# Patient Record
Sex: Male | Born: 1980
Health system: Southern US, Community
[De-identification: ages and names within clinical notes are randomized; demographics above are authoritative.]

## PROBLEM LIST (undated history)

## (undated) DIAGNOSIS — E785 Hyperlipidemia, unspecified: Secondary | ICD-10-CM

## (undated) HISTORY — PX: INNER EAR SURGERY: SHX679

## (undated) HISTORY — PX: BACK SURGERY: SHX140

## (undated) HISTORY — DX: Hyperlipidemia, unspecified: E78.5

---

## 2009-06-17 ENCOUNTER — Encounter: Admission: RE | Admit: 2009-06-17 | Discharge: 2009-06-17 | Payer: Self-pay | Admitting: Neurosurgery

## 2009-10-18 ENCOUNTER — Encounter: Admission: RE | Admit: 2009-10-18 | Discharge: 2009-10-18 | Payer: Self-pay | Admitting: Neurosurgery

## 2010-07-19 ENCOUNTER — Encounter: Admission: RE | Admit: 2010-07-19 | Discharge: 2010-07-19 | Payer: Self-pay | Admitting: Orthopedic Surgery

## 2016-04-21 ENCOUNTER — Ambulatory Visit (INDEPENDENT_AMBULATORY_CARE_PROVIDER_SITE_OTHER): Payer: BLUE CROSS/BLUE SHIELD | Admitting: Physician Assistant

## 2016-04-21 ENCOUNTER — Encounter: Payer: Self-pay | Admitting: Physician Assistant

## 2016-04-21 VITALS — BP 139/75 | HR 74 | Temp 97.4°F | Ht 72.0 in | Wt 280.3 lb

## 2016-04-21 DIAGNOSIS — M5441 Lumbago with sciatica, right side: Secondary | ICD-10-CM

## 2016-04-21 MED ORDER — CYCLOBENZAPRINE HCL 10 MG PO TABS: 10.0000 mg | ORAL_TABLET | Freq: Three times a day (TID) | ORAL | Status: DC | PRN

## 2016-04-21 MED ORDER — TRAMADOL HCL 50 MG PO TABS: 50.0000 mg | ORAL_TABLET | Freq: Three times a day (TID) | ORAL | Status: DC | PRN

## 2016-04-21 MED ORDER — PREDNISONE 10 MG (21) PO TBPK: 10.0000 mg | ORAL_TABLET | Freq: Every day | ORAL | Status: DC

## 2016-04-21 NOTE — Progress Notes (Signed)
Subjective:     Patient ID: Steven Romero, male   DOB: [birth year], 35 y.o.   MRN: [id number]  HPI Pt here as a new pt He has a long hx of intermit LBP with R radicular leg pain He has even seen a specialist in the past that recommended surgery At that time he rested and sx improved He work at a job that entails some heavy lifting Current sx for several days S increase with sitting No meds for sx  Review of Systems + R lower back pain + radicular pain to the R leg/buttock Intermit numbness No change in bowel/bladder function    Objective:   Physical Exam Appears uncomfortable Prefers to stand Uncomfortable with sitting ++ TTP of the midline to R L-spine area No palp spasm Decrease in ROM due to sx SLR positive on the R DTR 1+/= lower ext    Assessment:     1. Right-sided low back pain with right-sided sciatica        Plan:     Given hx and sx Sterapred DosePak 12 day 10 mg Flexeril 10mg  tid #30 - SE reviewed Ultram 50mg  #30- rx given today Heat/Ice Gentle stretching Work note If sx continue f/u for Xray and possible referral for MRI

## 2016-04-21 NOTE — Patient Instructions (Signed)

## 2016-04-24 ENCOUNTER — Telehealth: Payer: Self-pay | Admitting: Physician Assistant

## 2016-04-24 DIAGNOSIS — M545 Low back pain: Secondary | ICD-10-CM

## 2016-04-24 DIAGNOSIS — M5410 Radiculopathy, site unspecified: Secondary | ICD-10-CM

## 2016-04-24 NOTE — Telephone Encounter (Signed)
Ok to schedule MRI for LBP and radicular leg pain at hospital of his choice

## 2016-04-24 NOTE — Telephone Encounter (Signed)
Ok to extend work note 

## 2016-04-24 NOTE — Telephone Encounter (Signed)
Order placed

## 2016-04-25 ENCOUNTER — Telehealth: Payer: Self-pay | Admitting: Physician Assistant

## 2016-04-25 ENCOUNTER — Telehealth: Payer: Self-pay

## 2016-04-25 NOTE — Telephone Encounter (Signed)
Expected a call back yesterday about MRI (Debbi will work on getting authorized today) and a note for work   [employer]

## 2016-04-25 NOTE — Telephone Encounter (Signed)
We have been trying to call pt since yesterday. Called earlier this morning and left a message as pt didn't answer. Will attempt to call pt again and close this encounter as pt has 2 open calls.

## 2016-04-25 NOTE — Telephone Encounter (Signed)
Extended note for work written and pt is aware. They are aware we are working on the MRI to try and get it scheduled today. We will call as soon as we have it scheduled. Pt's wife voiced understanding.

## 2016-04-26 ENCOUNTER — Telehealth: Payer: Self-pay | Admitting: Physician Assistant

## 2016-04-26 ENCOUNTER — Ambulatory Visit (HOSPITAL_COMMUNITY)
Admission: RE | Admit: 2016-04-26 | Discharge: 2016-04-26 | Disposition: A | Discharge status: Home / Self Care | Payer: BLUE CROSS/BLUE SHIELD | Source: Ambulatory Visit | Attending: Nurse Practitioner | Admitting: Nurse Practitioner

## 2016-04-26 ENCOUNTER — Encounter: Payer: Self-pay | Admitting: Nurse Practitioner

## 2016-04-26 ENCOUNTER — Ambulatory Visit (INDEPENDENT_AMBULATORY_CARE_PROVIDER_SITE_OTHER): Payer: BLUE CROSS/BLUE SHIELD | Admitting: Nurse Practitioner

## 2016-04-26 ENCOUNTER — Other Ambulatory Visit: Payer: Self-pay | Admitting: Nurse Practitioner

## 2016-04-26 VITALS — BP 143/86 | HR 100 | Temp 97.5°F | Ht 72.0 in | Wt 277.0 lb

## 2016-04-26 DIAGNOSIS — M4806 Spinal stenosis, lumbar region: Secondary | ICD-10-CM | POA: Insufficient documentation

## 2016-04-26 DIAGNOSIS — M5441 Lumbago with sciatica, right side: Secondary | ICD-10-CM | POA: Diagnosis not present

## 2016-04-26 DIAGNOSIS — M5417 Radiculopathy, lumbosacral region: Secondary | ICD-10-CM

## 2016-04-26 DIAGNOSIS — M5127 Other intervertebral disc displacement, lumbosacral region: Secondary | ICD-10-CM | POA: Diagnosis not present

## 2016-04-26 MED ORDER — HYDROCODONE-ACETAMINOPHEN 5-325 MG PO TABS: 1.0000 | ORAL_TABLET | Freq: Four times a day (QID) | ORAL | Status: DC | PRN

## 2016-04-26 MED ORDER — KETOROLAC TROMETHAMINE 60 MG/2ML IM SOLN
60.0000 mg | Freq: Once | INTRAMUSCULAR | Status: AC
  Administered 2016-04-26: 60 mg via INTRAMUSCULAR

## 2016-04-26 NOTE — Addendum Note (Signed)
Addended by: Bennie PieriniMARTIN, MARY-MARGARET on: 04/26/2016 11:24 AM   Modules accepted: Orders

## 2016-04-26 NOTE — Progress Notes (Addendum)
   Subjective:    Patient ID: Steven Romero, male    DOB: 03-Oct-1981, 35 y.o.   MRN: [id number]  HPI  Patient in today c/o continued back pain-he saw B.Webster on 04/21/16 with same complaint. Dx with back pain with right sided sciatica- was given steroid pack, flexeril, and ultram. MRI was ordered but has gone to peer review. He rates pain today 10/10.and is radiating down right leg with numbness of right buttocks. He says that he cannot even get an erection since Friday and is having numbness in right testicle.  * has seen Dr. Channing Muttersoy in the past for disc problem. Has never had back surgery.  Review of Systems  Constitutional: Negative.   HENT: Negative.   Respiratory: Negative.   Cardiovascular: Negative.   Genitourinary: Negative.   Neurological: Negative.   Psychiatric/Behavioral: Negative.   All other systems reviewed and are negative.      Objective:   Physical Exam  Constitutional: He is oriented to person, place, and time. He appears well-developed and well-nourished. He appears distressed.  Cardiovascular: Normal rate, regular rhythm and normal heart sounds.   Pulmonary/Chest: Effort normal and breath sounds normal.  Musculoskeletal:  Decrease ROM of lumbar spine due to pain with slighest movement Right leg weaker then left Cannot sit still-   Neurological: He is alert and oriented to person, place, and time. He has normal reflexes.  Psychiatric: He has a normal mood and affect. His behavior is normal. Judgment and thought content normal.   BP 143/86 mmHg  Pulse 100  Temp(Src) 97.5 F (36.4 C) (Oral)  Ht 6' (1.829 m)  Wt 277 lb (125.646 kg)  BMI 37.56 kg/m2        Assessment & Plan:   1. Right-sided low back pain with right-sided sciatica    Meds ordered this encounter  Medications  . ketorolac (TORADOL) injection 60 mg    Sig:   . HYDROcodone-acetaminophen (LORTAB) 5-325 MG tablet    Sig: Take 1 tablet by mouth every 6 (six) hours as needed for moderate pain.      Dispense:  40 tablet    Refill:  0    Order Specific Question:  Supervising Provider    Answer:  Ernestina PennaMOORE, DONALD W [[id number]]    Trying to get approval for MRI Continue current meds  Mary-Margaret Daphine DeutscherMartin, FNP

## 2016-04-28 ENCOUNTER — Telehealth: Payer: Self-pay | Admitting: Physician Assistant

## 2016-04-28 NOTE — Telephone Encounter (Signed)
Patient was seen 5/24

## 2016-04-28 NOTE — Telephone Encounter (Signed)
Patient called stating that pain medication is not helping with the pain.  Patient will soon be out of flexeril and prednisone.  Would you like for him to continue this medication?

## 2016-04-28 NOTE — Telephone Encounter (Signed)
Patient aware and work note extended to 05/08/16.

## 2016-04-28 NOTE — Telephone Encounter (Signed)
There is no other pain meds that i can offer - he can continue flexeril if helps but wen runs out of prednisone he cannot have anymore

## 2020-05-18 ENCOUNTER — Telehealth: Payer: Self-pay | Admitting: Family Medicine

## 2020-05-19 ENCOUNTER — Ambulatory Visit: Payer: Self-pay | Admitting: Family Medicine

## 2020-07-09 ENCOUNTER — Ambulatory Visit: Payer: BLUE CROSS/BLUE SHIELD | Admitting: Family Medicine

## 2020-07-09 ENCOUNTER — Other Ambulatory Visit: Payer: Self-pay

## 2020-07-09 ENCOUNTER — Encounter: Payer: Self-pay | Admitting: Family Medicine

## 2020-07-09 VITALS — BP 142/86 | HR 83 | Temp 97.5°F | Ht 73.0 in | Wt 333.4 lb

## 2020-07-09 DIAGNOSIS — B360 Pityriasis versicolor: Secondary | ICD-10-CM | POA: Diagnosis not present

## 2020-07-09 DIAGNOSIS — K649 Unspecified hemorrhoids: Secondary | ICD-10-CM | POA: Diagnosis not present

## 2020-07-09 MED ORDER — HYDROCORTISONE ACETATE 25 MG RE SUPP: 25.0000 mg | Freq: Two times a day (BID) | RECTAL | 2 refills | Status: DC | PRN

## 2020-07-09 MED ORDER — CICLOPIROX OLAMINE 0.77 % EX CREA
TOPICAL_CREAM | Freq: Two times a day (BID) | CUTANEOUS | 2 refills | Status: DC
Start: 2020-07-09 — End: 2020-10-14

## 2020-07-09 MED ORDER — HYDROCORTISONE (PERIANAL) 2.5 % EX CREA: 1.0000 "application " | TOPICAL_CREAM | Freq: Two times a day (BID) | CUTANEOUS | 2 refills | Status: DC

## 2020-07-09 NOTE — Patient Instructions (Addendum)
Tucks pads   Hemorrhoids Hemorrhoids are swollen veins that may develop:  In the butt (rectum). These are called internal hemorrhoids.  Around the opening of the butt (anus). These are called external hemorrhoids. Hemorrhoids can cause pain, itching, or bleeding. Most of the time, they do not cause serious problems. They usually get better with diet changes, lifestyle changes, and other home treatments. What are the causes? This condition may be caused by:  Having trouble pooping (constipation).  Pushing hard (straining) to poop.  Watery poop (diarrhea).  Pregnancy.  Being very overweight (obese).  Sitting for long periods of time.  Heavy lifting or other activity that causes you to strain.  Anal sex.  Riding a bike for a long period of time. What are the signs or symptoms? Symptoms of this condition include:  Pain.  Itching or soreness in the butt.  Bleeding from the butt.  Leaking poop.  Swelling in the area.  One or more lumps around the opening of your butt. How is this diagnosed? A doctor can often diagnose this condition by looking at the affected area. The doctor may also:  Do an exam that involves feeling the area with a gloved hand (digital rectal exam).  Examine the area inside your butt using a small tube (anoscope).  Order blood tests. This may be done if you have lost a lot of blood.  Have you get a test that involves looking inside the colon using a flexible tube with a camera on the end (sigmoidoscopy or colonoscopy). How is this treated? This condition can usually be treated at home. Your doctor may tell you to change what you eat, make lifestyle changes, or try home treatments. If these do not help, procedures can be done to remove the hemorrhoids or make them smaller. These may involve:  Placing rubber bands at the base of the hemorrhoids to cut off their blood supply.  Injecting medicine into the hemorrhoids to shrink them.  Shining a  type of light energy onto the hemorrhoids to cause them to fall off.  Doing surgery to remove the hemorrhoids or cut off their blood supply. Follow these instructions at home: Eating and drinking   Eat foods that have a lot of fiber in them. These include whole grains, beans, nuts, fruits, and vegetables.  Ask your doctor about taking products that have added fiber (fibersupplements).  Reduce the amount of fat in your diet. You can do this by: ? Eating low-fat dairy products. ? Eating less red meat. ? Avoiding processed foods.  Drink enough fluid to keep your pee (urine) pale yellow. Managing pain and swelling   Take a warm-water bath (sitz bath) for 20 minutes to ease pain. Do this 3-4 times a day. You may do this in a bathtub or using a portable sitz bath that fits over the toilet.  If told, put ice on the painful area. It may be helpful to use ice between your warm baths. ? Put ice in a plastic bag. ? Place a towel between your skin and the bag. ? Leave the ice on for 20 minutes, 2-3 times a day. General instructions  Take over-the-counter and prescription medicines only as told by your doctor. ? Medicated creams and medicines may be used as told.  Exercise often. Ask your doctor how much and what kind of exercise is best for you.  Go to the bathroom when you have the urge to poop. Do not wait.  Avoid pushing too hard when you   poop.  Keep your butt dry and clean. Use wet toilet paper or moist towelettes after pooping.  Do not sit on the toilet for a long time.  Keep all follow-up visits as told by your doctor. This is important. Contact a doctor if you:  Have pain and swelling that do not get better with treatment or medicine.  Have trouble pooping.  Cannot poop.  Have pain or swelling outside the area of the hemorrhoids. Get help right away if you have:  Bleeding that will not stop. Summary  Hemorrhoids are swollen veins in the butt or around the opening of  the butt.  They can cause pain, itching, or bleeding.  Eat foods that have a lot of fiber in them. These include whole grains, beans, nuts, fruits, and vegetables.  Take a warm-water bath (sitz bath) for 20 minutes to ease pain. Do this 3-4 times a day. This information is not intended to replace advice given to you by your health care provider. Make sure you discuss any questions you have with your health care provider. Document Revised: 11/28/2018 Document Reviewed: 04/11/2018 Elsevier Patient Education  2020 Elsevier Inc.  

## 2020-07-09 NOTE — Progress Notes (Signed)
New Patient Office Visit  Assessment & Plan:  1. Tinea versicolor - ciclopirox (LOPROX) 0.77 % cream; Apply topically 2 (two) times daily.  Dispense: 90 g; Refill: 2  2. Hemorrhoids, unspecified hemorrhoid type - Education provided on hemorrhoids.  Encouraged use of suppositories, cream, and Tucks pads.  Patient will let me know if this is not effective and he needs a referral to have hemorrhoids removed. - hydrocortisone (ANUSOL-HC) 2.5 % rectal cream; Place 1 application rectally 2 (two) times daily.  Dispense: 30 g; Refill: 2 - hydrocortisone (ANUSOL-HC) 25 MG suppository; Place 1 suppository (25 mg total) rectally 2 (two) times daily as needed for hemorrhoids or anal itching.  Dispense: 12 suppository; Refill: 2   Follow-up: Return in about 3 months (around 10/09/2020) for follow-up of chronic medication conditions.   Steven Boston, MSN, APRN, FNP-C [facility]  Subjective:  Patient ID: Steven Romero, male    DOB: [birth year]  Age: 39 y.o. MRN: [id number]  Patient Care Team: Gwenlyn Fudge, FNP as PCP - General (Family Medicine)  CC:  Chief Complaint  Patient presents with  . Establish Care    Patient was seen here in 2017.  . Skin Discoloration    Patient states he noticed discoloration on bilateral legs and foot x 1 year.  . Hemorrhoids    Patient states he has had them for a few years.    HPI Steven Romero presents to establish care.   Patient is reestablishing care at our office.  He has not gone anywhere else, just has not been seen in greater than 3 years.  Patient reports discoloration of bilateral feet and legs that started about a year ago.  States it started with his feet and has just slowly progressed up his legs.  Denies any itching or irritation.  Patient also reports internal and external hemorrhoids that he has had for a few years now.  He denies constipation as long as he stays well-hydrated, which he tries hard at since he works  outside.   Review of Systems  Constitutional: Negative for chills, fever, malaise/fatigue and weight loss.  HENT: Negative for congestion, ear discharge, ear pain, nosebleeds, sinus pain, sore throat and tinnitus.   Eyes: Negative for blurred vision, double vision, pain, discharge and redness.  Respiratory: Negative for cough, shortness of breath and wheezing.   Cardiovascular: Negative for chest pain, palpitations and leg swelling.  Gastrointestinal: Negative for abdominal pain, constipation, diarrhea, heartburn, nausea and vomiting.  Genitourinary: Negative for dysuria, frequency and urgency.  Musculoskeletal: Negative for myalgias.  Skin: Negative for rash.  Neurological: Negative for dizziness, seizures, weakness and headaches.  Psychiatric/Behavioral: Negative for depression, substance abuse and suicidal ideas. The patient is not nervous/anxious.    No current outpatient medications on file.  No Known Allergies  Past Medical History:  Diagnosis Date  . Hyperlipidemia     Past Surgical History:  Procedure Laterality Date  . BACK SURGERY      Family History  Problem Relation Age of Onset  . Breast cancer Mother   . Cancer Father        colon  . Glaucoma Maternal Grandfather   . Cancer Paternal Grandmother        jaw    Social History   Socioeconomic History  . Marital status: Unknown    Spouse name: Not on file  . Number of children: Not on file  . Years of education: Not on file  . Highest education level: Not  on file  Occupational History  . Not on file  Tobacco Use  . Smoking status: Current Every Day Smoker    Packs/day: 1.00    Types: Cigarettes  . Smokeless tobacco: Never Used  Vaping Use  . Vaping Use: Former  Substance and Sexual Activity  . Alcohol use: Yes    Comment: occ  . Drug use: Yes    Types: Marijuana  . Sexual activity: Not on file  Other Topics Concern  . Not on file  Social History Narrative  . Not on file   Social  Determinants of Health   Financial Resource Strain:   . Difficulty of Paying Living Expenses:   Food Insecurity:   . Worried About Programme researcher, broadcasting/film/video in the Last Year:   . Barista in the Last Year:   Transportation Needs:   . Freight forwarder (Medical):   Marland Kitchen Lack of Transportation (Non-Medical):   Physical Activity:   . Days of Exercise per Week:   . Minutes of Exercise per Session:   Stress:   . Feeling of Stress :   Social Connections:   . Frequency of Communication with Friends and Family:   . Frequency of Social Gatherings with Friends and Family:   . Attends Religious Services:   . Active Member of Clubs or Organizations:   . Attends Banker Meetings:   Marland Kitchen Marital Status:   Intimate Partner Violence:   . Fear of Current or Ex-Partner:   . Emotionally Abused:   Marland Kitchen Physically Abused:   . Sexually Abused:     Objective:   Today's Vitals: BP (!) 142/86   Pulse 83   Temp (!) 97.5 F (36.4 C) (Temporal)   Ht 6\' 1"  (1.854 m)   Wt (!) 333 lb 6.4 oz (151.2 kg)   SpO2 95%   BMI 43.99 kg/m   Physical Exam Vitals reviewed.  Constitutional:      General: He is not in acute distress.    Appearance: Normal appearance. He is morbidly obese. He is not ill-appearing, toxic-appearing or diaphoretic.  HENT:     Head: Normocephalic and atraumatic.  Eyes:     General: No scleral icterus.       Right eye: No discharge.        Left eye: No discharge.     Conjunctiva/sclera: Conjunctivae normal.  Cardiovascular:     Rate and Rhythm: Normal rate and regular rhythm.     Heart sounds: Normal heart sounds. No murmur heard.  No friction rub. No gallop.   Pulmonary:     Effort: Pulmonary effort is normal. No respiratory distress.     Breath sounds: Normal breath sounds. No stridor. No wheezing, rhonchi or rales.  Musculoskeletal:        General: Normal range of motion.     Cervical back: Normal range of motion.  Skin:    General: Skin is warm and dry.      Comments: Brown discoloration to feet and legs bilaterally.   Neurological:     Mental Status: He is alert and oriented to person, place, and time. Mental status is at baseline.  Psychiatric:        Mood and Affect: Mood normal.        Behavior: Behavior normal.        Thought Content: Thought content normal.        Judgment: Judgment normal.

## 2020-07-12 ENCOUNTER — Encounter: Payer: Self-pay | Admitting: Family Medicine

## 2020-09-13 ENCOUNTER — Encounter: Payer: Self-pay | Admitting: *Deleted

## 2020-10-14 ENCOUNTER — Encounter: Payer: Self-pay | Admitting: Family

## 2020-10-14 ENCOUNTER — Ambulatory Visit: Payer: 59 | Admitting: Family

## 2020-10-14 ENCOUNTER — Other Ambulatory Visit: Payer: Self-pay

## 2020-10-14 VITALS — BP 125/72 | HR 71 | Temp 97.3°F | Ht 73.0 in | Wt 336.0 lb

## 2020-10-14 DIAGNOSIS — F17200 Nicotine dependence, unspecified, uncomplicated: Secondary | ICD-10-CM | POA: Diagnosis not present

## 2020-10-14 DIAGNOSIS — Z23 Encounter for immunization: Secondary | ICD-10-CM | POA: Diagnosis not present

## 2020-10-14 DIAGNOSIS — Z0001 Encounter for general adult medical examination with abnormal findings: Secondary | ICD-10-CM | POA: Diagnosis not present

## 2020-10-14 DIAGNOSIS — Z1159 Encounter for screening for other viral diseases: Secondary | ICD-10-CM | POA: Diagnosis not present

## 2020-10-14 DIAGNOSIS — Z114 Encounter for screening for human immunodeficiency virus [HIV]: Secondary | ICD-10-CM

## 2020-10-14 DIAGNOSIS — K219 Gastro-esophageal reflux disease without esophagitis: Secondary | ICD-10-CM

## 2020-10-14 DIAGNOSIS — Z0000 Encounter for general adult medical examination without abnormal findings: Secondary | ICD-10-CM

## 2020-10-14 DIAGNOSIS — Z716 Tobacco abuse counseling: Secondary | ICD-10-CM

## 2020-10-14 MED ORDER — VARENICLINE TARTRATE 1 MG PO TABS: 1.0000 mg | ORAL_TABLET | Freq: Two times a day (BID) | ORAL | 2 refills | Status: DC

## 2020-10-14 MED ORDER — CHANTIX STARTING MONTH PAK 0.5 MG X 11 & 1 MG X 42 PO TABS: ORAL_TABLET | ORAL | 0 refills | Status: DC

## 2020-10-14 MED ORDER — OMEPRAZOLE 20 MG PO CPDR: 20.0000 mg | DELAYED_RELEASE_CAPSULE | Freq: Every day | ORAL | 3 refills | Status: DC

## 2020-10-14 NOTE — Progress Notes (Signed)
Subjective:    Patient ID: Steven Romero, male    DOB: Jul 02, 1981, 39 y.o.   MRN: [id number]  Chief Complaint  Patient presents with  . Medical Management of Chronic Issues  . Heartburn  . Nicotine Dependence    wants medication to help stop smoking. He has tried patches    PT presents to the office today for CPE. He is currently not taking any medications at this time. Pt denies any headache, palpitations, SOB, or edema at this time.   He reports he smokes about a pack a day and is trying to quit. He has tired the patches, but states they do not stick to him. He reports he works outside and sweats a lot.  Heartburn He complains of belching and heartburn. He reports no early satiety, no globus sensation or no nausea. This is a chronic problem. The current episode started more than 1 year ago. The problem occurs occasionally. The problem has been waxing and waning. The symptoms are aggravated by smoking. Risk factors include obesity and smoking/tobacco exposure. He has tried an antacid for the symptoms. The treatment provided mild relief.  Nicotine Dependence Presents for follow-up visit. His urge triggers include company of smokers. The symptoms have been stable. He smokes 1 pack of cigarettes per day.      Review of Systems  Gastrointestinal: Positive for heartburn. Negative for nausea.  All other systems reviewed and are negative.  Family History  Problem Relation Age of Onset  . Breast cancer Mother   . Colon cancer Father   . Glaucoma Maternal Grandfather   . Bone cancer Paternal Grandmother        jaw   Social History   Socioeconomic History  . Marital status: Unknown    Spouse name: Not on file  . Number of children: Not on file  . Years of education: Not on file  . Highest education level: Not on file  Occupational History  . Not on file  Tobacco Use  . Smoking status: Current Every Day Smoker    Packs/day: 1.00    Types: Cigarettes  . Smokeless tobacco: Never  Used  Vaping Use  . Vaping Use: Former  Substance and Sexual Activity  . Alcohol use: Yes    Comment: occ  . Drug use: Yes    Types: Marijuana  . Sexual activity: Not on file  Other Topics Concern  . Not on file  Social History Narrative  . Not on file   Social Determinants of Health   Financial Resource Strain:   . Difficulty of Paying Living Expenses: Not on file  Food Insecurity:   . Worried About Charity fundraiser in the Last Year: Not on file  . Ran Out of Food in the Last Year: Not on file  Transportation Needs:   . Lack of Transportation (Medical): Not on file  . Lack of Transportation (Non-Medical): Not on file  Physical Activity:   . Days of Exercise per Week: Not on file  . Minutes of Exercise per Session: Not on file  Stress:   . Feeling of Stress : Not on file  Social Connections:   . Frequency of Communication with Friends and Family: Not on file  . Frequency of Social Gatherings with Friends and Family: Not on file  . Attends Religious Services: Not on file  . Active Member of Clubs or Organizations: Not on file  . Attends Archivist Meetings: Not on file  . Marital Status:  Not on file       Objective:   Physical Exam Vitals reviewed.  Constitutional:      General: He is not in acute distress.    Appearance: He is well-developed. He is obese.  HENT:     Head: Normocephalic.     Right Ear: Tympanic membrane normal.     Left Ear: Tympanic membrane normal.  Eyes:     General:        Right eye: No discharge.        Left eye: No discharge.     Pupils: Pupils are equal, round, and reactive to light.  Neck:     Thyroid: No thyromegaly.  Cardiovascular:     Rate and Rhythm: Normal rate and regular rhythm.     Heart sounds: Normal heart sounds. No murmur heard.   Pulmonary:     Effort: Pulmonary effort is normal. No respiratory distress.     Breath sounds: Normal breath sounds. No wheezing.  Abdominal:     General: Bowel sounds are  normal. There is no distension.     Palpations: Abdomen is soft.     Tenderness: There is no abdominal tenderness.  Musculoskeletal:        General: No tenderness. Normal range of motion.     Cervical back: Normal range of motion and neck supple.  Skin:    General: Skin is warm and dry.     Findings: No erythema or rash.  Neurological:     Mental Status: He is alert and oriented to person, place, and time.     Cranial Nerves: No cranial nerve deficit.     Deep Tendon Reflexes: Reflexes are normal and symmetric.  Psychiatric:        Behavior: Behavior normal.        Thought Content: Thought content normal.        Judgment: Judgment normal.      BP 125/72   Pulse 71   Temp (!) 97.3 F (36.3 C) (Temporal)   Ht _0  (1.854 m)   Wt (!) 336 lb (152.4 kg)   SpO2 96%   BMI 44.33 kg/m       Assessment & Plan:  Steven Romero comes in today with chief complaint of Medical Management of Chronic Issues, Heartburn, and Nicotine Dependence (wants medication to help stop smoking. He has tried patches )   Diagnosis and orders addressed:  1. Annual physical exam - CMP14+EGFR - CBC with Differential/Platelet - Lipid panel - TSH - HIV Antibody (routine testing w rflx) - Hepatitis C antibody  2. Current smoker Smoking cessation  - CMP14+EGFR - CBC with Differential/Platelet - varenicline (CHANTIX STARTING MONTH PAK) 0.5 MG X 11 & 1 MG X 42 tablet; Take one 0.5 mg tablet by mouth once daily for 3 days, then increase to one 0.5 mg tablet twice daily for 4 days, then increase to one 1 mg tablet twice daily.  Dispense: 53 tablet; Refill: 0 - varenicline (CHANTIX CONTINUING MONTH PAK) 1 MG tablet; Take 1 tablet (1 mg total) by mouth 2 (two) times daily.  Dispense: 60 tablet; Refill: 2  3. Encounter for smoking cessation counseling Spent approx 5 mins of smoking cessation Will start Chantix today Possible adverse effects discussed, will stop if increased depression or SI -  CMP14+EGFR - CBC with Differential/Platelet - varenicline (CHANTIX STARTING MONTH PAK) 0.5 MG X 11 & 1 MG X 42 tablet; Take one 0.5 mg tablet by mouth once daily for 3  days, then increase to one 0.5 mg tablet twice daily for 4 days, then increase to one 1 mg tablet twice daily.  Dispense: 53 tablet; Refill: 0 - varenicline (CHANTIX CONTINUING MONTH PAK) 1 MG tablet; Take 1 tablet (1 mg total) by mouth 2 (two) times daily.  Dispense: 60 tablet; Refill: 2  4. Need for hepatitis C screening test - CMP14+EGFR - CBC with Differential/Platelet  5. Encounter for screening for HIV - CMP14+EGFR - CBC with Differential/Platelet  6. Gastroesophageal reflux disease, unspecified whether esophagitis present - CMP14+EGFR - CBC with Differential/Platelet - omeprazole (PRILOSEC) 20 MG capsule; Take 1 capsule (20 mg total) by mouth daily.  Dispense: 90 capsule; Refill: 3  7. Morbid obesity ([place])   Labs pending Health Maintenance reviewed Diet and exercise encouraged  Follow up plan: 1 year    Evelina Dun, FNP

## 2020-10-14 NOTE — Patient Instructions (Signed)
Varenicline oral tablets What is this medicine? VARENICLINE (var e NI kleen) is used to help people quit smoking. It is used with a patient support program recommended by your physician. This medicine may be used for other purposes; ask your health care provider or pharmacist if you have questions. COMMON BRAND NAME(S): Chantix What should I tell my health care provider before I take this medicine? They need to know if you have any of these conditions:  heart disease  if you often drink alcohol  kidney disease  mental illness  on hemodialysis  seizures  history of stroke  suicidal thoughts, plans, or attempt; a previous suicide attempt by you or a family member  an unusual or allergic reaction to varenicline, other medicines, foods, dyes, or preservatives  pregnant or trying to get pregnant  breast-feeding How should I use this medicine? Take this medicine by mouth after eating. Take with a full glass of water. Follow the directions on the prescription label. Take your doses at regular intervals. Do not take your medicine more often than directed. There are 3 ways you can use this medicine to help you quit smoking; talk to your health care professional to decide which plan is right for you: 1) you can choose a quit date and start this medicine 1 week before the quit date, or, 2) you can start taking this medicine before you choose a quit date, and then pick a quit date between day 8 and 35 days of treatment, or, 3) if you are not sure that you are able or willing to quit smoking right away, start taking this medicine and slowly decrease the amount you smoke as directed by your health care professional with the goal of being cigarette-free by week 12 of treatment. Stick to your plan; ask about support groups or other ways to help you remain cigarette-free. If you are motivated to quit smoking and did not succeed during a previous attempt with this medicine for reasons other than  side effects, or if you returned to smoking after this treatment, speak with your health care professional about whether another course of this medicine may be right for you. A special MedGuide will be given to you by the pharmacist with each prescription and refill. Be sure to read this information carefully each time. Talk to your pediatrician regarding the use of this medicine in children. This medicine is not approved for use in children. Overdosage: If you think you have taken too much of this medicine contact a poison control center or emergency room at once. NOTE: This medicine is only for you. Do not share this medicine with others. What if I miss a dose? If you miss a dose, take it as soon as you can. If it is almost time for your next dose, take only that dose. Do not take double or extra doses. What may interact with this medicine?  alcohol  insulin  other medicines used to help people quit smoking  theophylline  warfarin This list may not describe all possible interactions. Give your health care provider a list of all the medicines, herbs, non-prescription drugs, or dietary supplements you use. Also tell them if you smoke, drink alcohol, or use illegal drugs. Some items may interact with your medicine. What should I watch for while using this medicine? It is okay if you do not succeed at your attempt to quit and have a cigarette. You can still continue your quit attempt and keep using this medicine as directed.   Just throw away your cigarettes and get back to your quit plan. Talk to your health care provider before using other treatments to quit smoking. Using this medicine with other treatments to quit smoking may increase the risk for side effects compared to using a treatment alone. You may get drowsy or dizzy. Do not drive, use machinery, or do anything that needs mental alertness until you know how this medicine affects you. Do not stand or sit up quickly, especially if you are  an older patient. This reduces the risk of dizzy or fainting spells. Decrease the number of alcoholic beverages that you drink during treatment with this medicine until you know if this medicine affects your ability to tolerate alcohol. Some people have experienced increased drunkenness (intoxication), unusual or sometimes aggressive behavior, or no memory of things that have happened (amnesia) during treatment with this medicine. Sleepwalking can happen during treatment with this medicine, and can sometimes lead to behavior that is harmful to you, other people, or property. Stop taking this medicine and tell your doctor if you start sleepwalking or have other unusual sleep-related activity. After taking this medicine, you may get up out of bed and do an activity that you do not know you are doing. The next morning, you may have no memory of this. Activities include driving a car ("sleep-driving"), making and eating food, talking on the phone, sexual activity, and sleep-walking. Serious injuries have occurred. Stop the medicine and call your doctor right away if you find out you have done any of these activities. Do not take this medicine if you have used alcohol that evening. Do not take it if you have taken another medicine for sleep. The risk of doing these sleep-related activities is higher. Patients and their families should watch out for new or worsening depression or thoughts of suicide. Also watch out for sudden changes in feelings such as feeling anxious, agitated, panicky, irritable, hostile, aggressive, impulsive, severely restless, overly excited and hyperactive, or not being able to sleep. If this happens, call your health care professional. If you have diabetes and you quit smoking, the effects of insulin may be increased and you may need to reduce your insulin dose. Check with your doctor or health care professional about how you should adjust your insulin dose. What side effects may I notice  from receiving this medicine? Side effects that you should report to your doctor or health care professional as soon as possible:  allergic reactions like skin rash, itching or hives, swelling of the face, lips, tongue, or throat  acting aggressive, being angry or violent, or acting on dangerous impulses  breathing problems  changes in emotions or moods  chest pain or chest tightness  feeling faint or lightheaded, falls  hallucination, loss of contact with reality  mouth sores  redness, blistering, peeling or loosening of the skin, including inside the mouth  signs and symptoms of a stroke like changes in vision; confusion; trouble speaking or understanding; severe headaches; sudden numbness or weakness of the face, arm or leg; trouble walking; dizziness; loss of balance or coordination  seizures  sleepwalking  suicidal thoughts or other mood changes Side effects that usually do not require medical attention (report to your doctor or health care professional if they continue or are bothersome):  constipation  gas  headache  nausea, vomiting  strange dreams  trouble sleeping This list may not describe all possible side effects. Call your doctor for medical advice about side effects. You may report side   effects to FDA at 1-800-FDA-1088. Where should I keep my medicine? Keep out of the reach of children. Store at room temperature between 15 and 30 degrees C (59 and 86 degrees F). Throw away any unused medicine after the expiration date. NOTE: This sheet is a summary. It may not cover all possible information. If you have questions about this medicine, talk to your doctor, pharmacist, or health care provider.  2020 Elsevier/Gold Standard (2018-11-08 14:27:36) Gastroesophageal Reflux Disease, Adult Gastroesophageal reflux (GER) happens when acid from the stomach flows up into the tube that connects the mouth and the stomach (esophagus). Normally, food travels down the  esophagus and stays in the stomach to be digested. However, when a person has GER, food and stomach acid sometimes move back up into the esophagus. If this becomes a more serious problem, the person may be diagnosed with a disease called gastroesophageal reflux disease (GERD). GERD occurs when the reflux:  Happens often.  Causes frequent or severe symptoms.  Causes problems such as damage to the esophagus. When stomach acid comes in contact with the esophagus, the acid may cause soreness (inflammation) in the esophagus. Over time, GERD may create small holes (ulcers) in the lining of the esophagus. What are the causes? This condition is caused by a problem with the muscle between the esophagus and the stomach (lower esophageal sphincter, or LES). Normally, the LES muscle closes after food passes through the esophagus to the stomach. When the LES is weakened or abnormal, it does not close properly, and that allows food and stomach acid to go back up into the esophagus. The LES can be weakened by certain dietary substances, medicines, and medical conditions, including:  Tobacco use.  Pregnancy.  Having a hiatal hernia.  Alcohol use.  Certain foods and beverages, such as coffee, chocolate, onions, and peppermint. What increases the risk? You are more likely to develop this condition if you:  Have an increased body weight.  Have a connective tissue disorder.  Use NSAID medicines. What are the signs or symptoms? Symptoms of this condition include:  Heartburn.  Difficult or painful swallowing.  The feeling of having a lump in the throat.  Abitter taste in the mouth.  Bad breath.  Having a large amount of saliva.  Having an upset or bloated stomach.  Belching.  Chest pain. Different conditions can cause chest pain. Make sure you see your health care provider if you experience chest pain.  Shortness of breath or wheezing.  Ongoing (chronic) cough or a night-time  cough.  Wearing away of tooth enamel.  Weight loss. How is this diagnosed? Your health care provider will take a medical history and perform a physical exam. To determine if you have mild or severe GERD, your health care provider may also monitor how you respond to treatment. You may also have tests, including:  A test to examine your stomach and esophagus with a small camera (endoscopy).  A test thatmeasures the acidity level in your esophagus.  A test thatmeasures how much pressure is on your esophagus.  A barium swallow or modified barium swallow test to show the shape, size, and functioning of your esophagus. How is this treated? The goal of treatment is to help relieve your symptoms and to prevent complications. Treatment for this condition may vary depending on how severe your symptoms are. Your health care provider may recommend:  Changes to your diet.  Medicine.  Surgery. Follow these instructions at home: Eating and drinking   Follow a  diet as recommended by your health care provider. This may involve avoiding foods and drinks such as: ? Coffee and tea (with or without caffeine). ? Drinks that containalcohol. ? Energy drinks and sports drinks. ? Carbonated drinks or sodas. ? Chocolate and cocoa. ? Peppermint and mint flavorings. ? Garlic and onions. ? Horseradish. ? Spicy and acidic foods, including peppers, chili powder, curry powder, vinegar, hot sauces, and barbecue sauce. ? Citrus fruit juices and citrus fruits, such as oranges, lemons, and limes. ? Tomato-based foods, such as red sauce, chili, salsa, and pizza with red sauce. ? Fried and fatty foods, such as donuts, french fries, potato chips, and high-fat dressings. ? High-fat meats, such as hot dogs and fatty cuts of red and white meats, such as rib eye steak, sausage, ham, and bacon. ? High-fat dairy items, such as whole milk, butter, and cream cheese.  Eat small, frequent meals instead of large  meals.  Avoid drinking large amounts of liquid with your meals.  Avoid eating meals during the 2-3 hours before bedtime.  Avoid lying down right after you eat.  Do not exercise right after you eat. Lifestyle   Do not use any products that contain nicotine or tobacco, such as cigarettes, e-cigarettes, and chewing tobacco. If you need help quitting, ask your health care provider.  Try to reduce your stress by using methods such as yoga or meditation. If you need help reducing stress, ask your health care provider.  If you are overweight, reduce your weight to an amount that is healthy for you. Ask your health care provider for guidance about a safe weight loss goal. General instructions  Pay attention to any changes in your symptoms.  Take over-the-counter and prescription medicines only as told by your health care provider. Do not take aspirin, ibuprofen, or other NSAIDs unless your health care provider told you to do so.  Wear loose-fitting clothing. Do not wear anything tight around your waist that causes pressure on your abdomen.  Raise (elevate) the head of your bed about 6 inches (15 cm).  Avoid bending over if this makes your symptoms worse.  Keep all follow-up visits as told by your health care provider. This is important. Contact a health care provider if:  You have: ? New symptoms. ? Unexplained weight loss. ? Difficulty swallowing or it hurts to swallow. ? Wheezing or a persistent cough. ? A hoarse voice.  Your symptoms do not improve with treatment. Get help right away if you:  Have pain in your arms, neck, jaw, teeth, or back.  Feel sweaty, dizzy, or light-headed.  Have chest pain or shortness of breath.  Vomit and your vomit looks like blood or coffee grounds.  Faint.  Have stool that is bloody or black.  Cannot swallow, drink, or eat. Summary  Gastroesophageal reflux happens when acid from the stomach flows up into the esophagus. GERD is a disease  in which the reflux happens often, causes frequent or severe symptoms, or causes problems such as damage to the esophagus.  Treatment for this condition may vary depending on how severe your symptoms are. Your health care provider may recommend diet and lifestyle changes, medicine, or surgery.  Contact a health care provider if you have new or worsening symptoms.  Take over-the-counter and prescription medicines only as told by your health care provider. Do not take aspirin, ibuprofen, or other NSAIDs unless your health care provider told you to do so.  Keep all follow-up visits as told by your  health care provider. This is important. This information is not intended to replace advice given to you by your health care provider. Make sure you discuss any questions you have with your health care provider. Document Revised: 05/29/2018 Document Reviewed: 05/29/2018 Elsevier Patient Education  2020 [employer].

## 2020-10-15 ENCOUNTER — Ambulatory Visit: Payer: 59 | Admitting: Family

## 2020-10-15 LAB — CBC WITH DIFFERENTIAL/PLATELET
Basophils Absolute: 0.1 10*3/uL (ref 0.0–0.2)
Basos: 1 %
EOS (ABSOLUTE): 0.1 10*3/uL (ref 0.0–0.4)
Eos: 1 %
Hematocrit: 47.4 % (ref 37.5–51.0)
Hemoglobin: 16 g/dL (ref 13.0–17.7)
Immature Grans (Abs): 0 10*3/uL (ref 0.0–0.1)
Immature Granulocytes: 0 %
Lymphocytes Absolute: 2.1 10*3/uL (ref 0.7–3.1)
Lymphs: 28 %
MCH: 31.4 pg (ref 26.6–33.0)
MCHC: 33.8 g/dL (ref 31.5–35.7)
MCV: 93 fL (ref 79–97)
Monocytes Absolute: 0.5 10*3/uL (ref 0.1–0.9)
Monocytes: 7 %
Neutrophils Absolute: 4.6 10*3/uL (ref 1.4–7.0)
Neutrophils: 63 %
Platelets: 180 10*3/uL (ref 150–450)
RBC: 5.1 x10E6/uL (ref 4.14–5.80)
RDW: 12.9 % (ref 11.6–15.4)
WBC: 7.4 10*3/uL (ref 3.4–10.8)

## 2020-10-15 LAB — HEPATITIS C ANTIBODY: Hep C Virus Ab: <0.1 s/co ratio (ref 0.0–0.9)

## 2020-10-15 LAB — LIPID PANEL
Chol/HDL Ratio: 7.3 ratio — ABNORMAL HIGH (ref 0.0–5.0)
Cholesterol, Total: 234 mg/dL — ABNORMAL HIGH (ref 100–199)
HDL: 32 mg/dL — ABNORMAL LOW (ref >39)
LDL Chol Calc (NIH): 150 mg/dL — ABNORMAL HIGH (ref 0–99)
Triglycerides: 281 mg/dL — ABNORMAL HIGH (ref 0–149)
VLDL Cholesterol Cal: 52 mg/dL — ABNORMAL HIGH (ref 5–40)

## 2020-10-15 LAB — CMP14+EGFR
ALT: 76 IU/L — ABNORMAL HIGH (ref 0–44)
AST: 29 IU/L (ref 0–40)
Albumin/Globulin Ratio: 2.4 — ABNORMAL HIGH (ref 1.2–2.2)
Albumin: 4.7 g/dL (ref 4.0–5.0)
Alkaline Phosphatase: 88 IU/L (ref 44–121)
BUN/Creatinine Ratio: 13 (ref 9–20)
BUN: 12 mg/dL (ref 6–20)
Bilirubin Total: 0.3 mg/dL (ref 0.0–1.2)
CO2: 25 mmol/L (ref 20–29)
Calcium: 9.9 mg/dL (ref 8.7–10.2)
Chloride: 102 mmol/L (ref 96–106)
Creatinine, Ser: 0.95 mg/dL (ref 0.76–1.27)
GFR calc Af Amer: 116 mL/min/{1.73_m2} (ref >59)
GFR calc non Af Amer: 100 mL/min/{1.73_m2} (ref >59)
Globulin, Total: 2 g/dL (ref 1.5–4.5)
Glucose: 98 mg/dL (ref 65–99)
Potassium: 4.8 mmol/L (ref 3.5–5.2)
Sodium: 143 mmol/L (ref 134–144)
Total Protein: 6.7 g/dL (ref 6.0–8.5)

## 2020-10-15 LAB — HIV ANTIBODY (ROUTINE TESTING W REFLEX): HIV Screen 4th Generation wRfx: NONREACTIVE

## 2020-10-15 LAB — TSH: TSH: 1.69 u[IU]/mL (ref 0.450–4.500)

## 2020-12-09 ENCOUNTER — Other Ambulatory Visit: Payer: Self-pay | Admitting: Family Medicine

## 2020-12-09 DIAGNOSIS — B360 Pityriasis versicolor: Secondary | ICD-10-CM

## 2021-01-14 ENCOUNTER — Ambulatory Visit: Payer: 59 | Admitting: Family Medicine

## 2021-02-07 ENCOUNTER — Ambulatory Visit: Payer: 59 | Admitting: Family Medicine

## 2021-02-07 ENCOUNTER — Other Ambulatory Visit: Payer: Self-pay

## 2021-04-04 ENCOUNTER — Encounter: Payer: Self-pay | Admitting: Family Medicine

## 2021-04-04 ENCOUNTER — Ambulatory Visit (INDEPENDENT_AMBULATORY_CARE_PROVIDER_SITE_OTHER): Payer: 59 | Admitting: Family Medicine

## 2021-04-04 ENCOUNTER — Other Ambulatory Visit: Payer: Self-pay

## 2021-04-04 VITALS — BP 139/90 | HR 81 | Temp 97.0°F | Ht 73.0 in | Wt 341.1 lb

## 2021-04-04 DIAGNOSIS — R748 Abnormal levels of other serum enzymes: Secondary | ICD-10-CM

## 2021-04-04 DIAGNOSIS — L819 Disorder of pigmentation, unspecified: Secondary | ICD-10-CM

## 2021-04-04 DIAGNOSIS — F17200 Nicotine dependence, unspecified, uncomplicated: Secondary | ICD-10-CM

## 2021-04-04 DIAGNOSIS — E782 Mixed hyperlipidemia: Secondary | ICD-10-CM

## 2021-04-04 LAB — LIPID PANEL
Chol/HDL Ratio: 8.2 ratio — ABNORMAL HIGH (ref 0.0–5.0)
Cholesterol, Total: 237 mg/dL — ABNORMAL HIGH (ref 100–199)
HDL: 29 mg/dL — ABNORMAL LOW (ref >39)
LDL Chol Calc (NIH): 160 mg/dL — ABNORMAL HIGH (ref 0–99)
Triglycerides: 255 mg/dL — ABNORMAL HIGH (ref 0–149)
VLDL Cholesterol Cal: 48 mg/dL — ABNORMAL HIGH (ref 5–40)

## 2021-04-04 LAB — CMP14+EGFR
ALT: 77 IU/L — ABNORMAL HIGH (ref 0–44)
AST: 33 IU/L (ref 0–40)
Albumin/Globulin Ratio: 2.4 — ABNORMAL HIGH (ref 1.2–2.2)
Albumin: 4.6 g/dL (ref 4.0–5.0)
Alkaline Phosphatase: 87 IU/L (ref 44–121)
BUN/Creatinine Ratio: 14 (ref 9–20)
BUN: 14 mg/dL (ref 6–20)
Bilirubin Total: 0.4 mg/dL (ref 0.0–1.2)
CO2: 23 mmol/L (ref 20–29)
Calcium: 9.4 mg/dL (ref 8.7–10.2)
Chloride: 105 mmol/L (ref 96–106)
Creatinine, Ser: 1.03 mg/dL (ref 0.76–1.27)
Globulin, Total: 1.9 g/dL (ref 1.5–4.5)
Glucose: 108 mg/dL — ABNORMAL HIGH (ref 65–99)
Potassium: 4.3 mmol/L (ref 3.5–5.2)
Sodium: 143 mmol/L (ref 134–144)
Total Protein: 6.5 g/dL (ref 6.0–8.5)
eGFR: 95 mL/min/{1.73_m2} (ref >59)

## 2021-04-04 NOTE — Progress Notes (Signed)
Assessment & Plan:  1. Elevated liver enzymes Discussed avoiding alcohol.  Also discussed that his elevated cholesterol levels could be impacting his liver enzymes. - CMP14+EGFR  2. Mixed hyperlipidemia Education provided on hyperlipidemia.  Encouraged diet, exercise, and smoking cessation. - CMP14+EGFR - Lipid panel  3. Discoloration of skin of lower leg - Ambulatory referral to Dermatology  4. Current smoker Patient is going to try patches again.  He was encouraged to try placing a Tegaderm over the patch.   Return as directed after labs result.  Hendricks Limes, MSN, APRN, FNP-C [facility]  Subjective:    Patient ID: Steven Romero, male    DOB: [birth year], 40 y.o.   MRN: [id number]  Patient Care Team: Loman Brooklyn, FNP as PCP - General (Family Medicine)   Chief Complaint:  Chief Complaint  Patient presents with  . Elevated Hepatic Enzymes    3 month follow up   . discoloration bilateral legs and feet    X 1 yr    HPI: Steven Romero is a 40 y.o. male presenting on 04/04/2021 for Elevated Hepatic Enzymes (3 month follow up ) and discoloration bilateral legs and feet (X 1 yr)  Patient is here for a follow-up of elevated liver enzymes.  His ALT was elevated at 76 on his lab work that was completed in November of last year.  He has not a regular drinker of alcohol, but did just get back from vacation where he drank some alcohol.  He does have high cholesterol.  Patient is a current smoker and would like to try to quit.  Chantix was not helpful for him.  He states the patches are helpful but they do not stay on because he sweats a lot.  New complaints: Patient reports he still has discoloration to his lower extremities.  He previously tried antifungal cream which was not effective.  Social history:  Relevant past medical, surgical, family and social history reviewed and updated as indicated. Interim medical history since our last visit  reviewed.  Allergies and medications reviewed and updated.  DATA REVIEWED: CHART IN EPIC  ROS: Negative unless specifically indicated above in HPI.    Current Outpatient Medications:  .  omeprazole (PRILOSEC) 20 MG capsule, Take 1 capsule (20 mg total) by mouth daily., Disp: 90 capsule, Rfl: 3   No Known Allergies Past Medical History:  Diagnosis Date  . Hyperlipidemia     Past Surgical History:  Procedure Laterality Date  . BACK SURGERY      Social History   Socioeconomic History  . Marital status: Unknown    Spouse name: Not on file  . Number of children: Not on file  . Years of education: Not on file  . Highest education level: Not on file  Occupational History  . Not on file  Tobacco Use  . Smoking status: Current Every Day Smoker    Packs/day: 1.00    Types: Cigarettes  . Smokeless tobacco: Never Used  Vaping Use  . Vaping Use: Former  Substance and Sexual Activity  . Alcohol use: Yes    Comment: occ  . Drug use: Yes    Types: Marijuana  . Sexual activity: Not on file  Other Topics Concern  . Not on file  Social History Narrative  . Not on file   Social Determinants of Health   Financial Resource Strain: Not on file  Food Insecurity: Not on file  Transportation Needs: Not on file  Physical Activity: Not  on file  Stress: Not on file  Social Connections: Not on file  Intimate Partner Violence: Not on file        Objective:    BP 139/90   Pulse 81   Temp (!) 97 F (36.1 C) (Temporal)   Ht 6' 1" (1.854 m)   Wt (!) 341 lb 1.6 oz (154.7 kg)   SpO2 95%   BMI 45.00 kg/m   Wt Readings from Last 3 Encounters:  04/04/21 (!) 341 lb 1.6 oz (154.7 kg)  10/14/20 (!) 336 lb (152.4 kg)  07/09/20 (!) 333 lb 6.4 oz (151.2 kg)    Physical Exam Vitals reviewed.  Constitutional:      General: He is not in acute distress.    Appearance: Normal appearance. He is morbidly obese. He is not ill-appearing, toxic-appearing or diaphoretic.  HENT:     Head:  Normocephalic and atraumatic.  Eyes:     General: No scleral icterus.       Right eye: No discharge.        Left eye: No discharge.     Conjunctiva/sclera: Conjunctivae normal.  Cardiovascular:     Rate and Rhythm: Normal rate and regular rhythm.     Heart sounds: Normal heart sounds. No murmur heard. No friction rub. No gallop.   Pulmonary:     Effort: Pulmonary effort is normal. No respiratory distress.     Breath sounds: Normal breath sounds. No stridor. No wheezing, rhonchi or rales.  Musculoskeletal:        General: Normal range of motion.     Cervical back: Normal range of motion.  Skin:    General: Skin is warm and dry.  Neurological:     Mental Status: He is alert and oriented to person, place, and time. Mental status is at baseline.  Psychiatric:        Mood and Affect: Mood normal.        Behavior: Behavior normal.        Thought Content: Thought content normal.        Judgment: Judgment normal.     Lab Results  Component Value Date   TSH 1.690 10/14/2020   Lab Results  Component Value Date   WBC 7.4 10/14/2020   HGB 16.0 10/14/2020   HCT 47.4 10/14/2020   MCV 93 10/14/2020   PLT 180 10/14/2020   Lab Results  Component Value Date   NA 143 10/14/2020   K 4.8 10/14/2020   CO2 25 10/14/2020   GLUCOSE 98 10/14/2020   BUN 12 10/14/2020   CREATININE 0.95 10/14/2020   BILITOT 0.3 10/14/2020   ALKPHOS 88 10/14/2020   AST 29 10/14/2020   ALT 76 (H) 10/14/2020   PROT 6.7 10/14/2020   ALBUMIN 4.7 10/14/2020   CALCIUM 9.9 10/14/2020   Lab Results  Component Value Date   CHOL 234 (H) 10/14/2020   Lab Results  Component Value Date   HDL 32 (L) 10/14/2020   Lab Results  Component Value Date   LDLCALC 150 (H) 10/14/2020   Lab Results  Component Value Date   TRIG 281 (H) 10/14/2020   Lab Results  Component Value Date   CHOLHDL 7.3 (H) 10/14/2020   No results found for: HGBA1C

## 2021-04-04 NOTE — Patient Instructions (Signed)
High Cholesterol  High cholesterol is a condition in which the blood has high levels of a white, waxy substance similar to fat (cholesterol). The liver makes all the cholesterol that the body needs. The human body needs small amounts of cholesterol to help build cells. A person gets extra or excess cholesterol from the food that he or she eats. The blood carries cholesterol from the liver to the rest of the body. If you have high cholesterol, deposits (plaques) may build up on the walls of your arteries. Arteries are the blood vessels that carry blood away from your heart. These plaques make the arteries narrow and stiff. Cholesterol plaques increase your risk for heart attack and stroke. Work with your health care provider to keep your cholesterol levels in a healthy range. What increases the risk? The following factors may make you more likely to develop this condition:  Eating foods that are high in animal fat (saturated fat) or cholesterol.  Being overweight.  Not getting enough exercise.  A family history of high cholesterol (familial hypercholesterolemia).  Use of tobacco products.  Having diabetes. What are the signs or symptoms? There are no symptoms of this condition. How is this diagnosed? This condition may be diagnosed based on the results of a blood test.  If you are older than 40 years of age, your health care provider may check your cholesterol levels every 4-6 years.  You may be checked more often if you have high cholesterol or other risk factors for heart disease. The blood test for cholesterol measures:  "Bad" cholesterol, or LDL cholesterol. This is the main type of cholesterol that causes heart disease. The desired level is less than 100 mg/dL.  "Good" cholesterol, or HDL cholesterol. HDL helps protect against heart disease by cleaning the arteries and carrying the LDL to the liver for processing. The desired level for HDL is 60 mg/dL or higher.  Triglycerides.  These are fats that your body can store or burn for energy. The desired level is less than 150 mg/dL.  Total cholesterol. This measures the total amount of cholesterol in your blood and includes LDL, HDL, and triglycerides. The desired level is less than 200 mg/dL. How is this treated? This condition may be treated with:  Diet changes. You may be asked to eat foods that have more fiber and less saturated fats or added sugar.  Lifestyle changes. These may include regular exercise, maintaining a healthy weight, and quitting use of tobacco products.  Medicines. These are given when diet and lifestyle changes have not worked. You may be prescribed a statin medicine to help lower your cholesterol levels. Follow these instructions at home: Eating and drinking  Eat a healthy, balanced diet. This diet includes: ? Daily servings of a variety of fresh, frozen, or canned fruits and vegetables. ? Daily servings of whole grain foods that are rich in fiber. ? Foods that are low in saturated fats and trans fats. These include poultry and fish without skin, lean cuts of meat, and low-fat dairy products. ? A variety of fish, especially oily fish that contain omega-3 fatty acids. Aim to eat fish at least 2 times a week.  Avoid foods and drinks that have added sugar.  Use healthy cooking methods, such as roasting, grilling, broiling, baking, poaching, steaming, and stir-frying. Do not fry your food except for stir-frying.   Lifestyle  Get regular exercise. Aim to exercise for a total of 150 minutes a week. Increase your activity level by doing activities   such as gardening, walking, and taking the stairs.  Do not use any products that contain nicotine or tobacco, such as cigarettes, e-cigarettes, and chewing tobacco. If you need help quitting, ask your health care provider.   General instructions  Take over-the-counter and prescription medicines only as told by your health care provider.  Keep all  follow-up visits as told by your health care provider. This is important. Where to find more information  American Heart Association: www.heart.org  National Heart, Lung, and Blood Institute: www.nhlbi.nih.gov Contact a health care provider if:  You have trouble achieving or maintaining a healthy diet or weight.  You are starting an exercise program.  You are unable to stop smoking. Get help right away if:  You have chest pain.  You have trouble breathing.  You have any symptoms of a stroke. "BE FAST" is an easy way to remember the main warning signs of a stroke: ? B - Balance. Signs are dizziness, sudden trouble walking, or loss of balance. ? E - Eyes. Signs are trouble seeing or a sudden change in vision. ? F - Face. Signs are sudden weakness or numbness of the face, or the face or eyelid drooping on one side. ? A - Arms. Signs are weakness or numbness in an arm. This happens suddenly and usually on one side of the body. ? S - Speech. Signs are sudden trouble speaking, slurred speech, or trouble understanding what people say. ? T - Time. Time to call emergency services. Write down what time symptoms started.  You have other signs of a stroke, such as: ? A sudden, severe headache with no known cause. ? Nausea or vomiting. ? Seizure. These symptoms may represent a serious problem that is an emergency. Do not wait to see if the symptoms will go away. Get medical help right away. Call your local emergency services (911 in the [place] Do not drive yourself to the hospital. Summary  Cholesterol plaques increase your risk for heart attack and stroke. Work with your health care provider to keep your cholesterol levels in a healthy range.  Eat a healthy, balanced diet, get regular exercise, and maintain a healthy weight.  Do not use any products that contain nicotine or tobacco, such as cigarettes, e-cigarettes, and chewing tobacco.  Get help right away if you have any symptoms of a  stroke. This information is not intended to replace advice given to you by your health care provider. Make sure you discuss any questions you have with your health care provider. Document Revised: 10/20/2019 Document Reviewed: 10/20/2019 Elsevier Patient Education  2021 [employer]  

## 2021-06-10 ENCOUNTER — Ambulatory Visit: Payer: BC Managed Care – PPO | Admitting: Family Medicine

## 2021-06-10 ENCOUNTER — Other Ambulatory Visit: Payer: Self-pay

## 2021-06-10 ENCOUNTER — Ambulatory Visit (INDEPENDENT_AMBULATORY_CARE_PROVIDER_SITE_OTHER): Payer: BC Managed Care – PPO

## 2021-06-10 ENCOUNTER — Encounter: Payer: Self-pay | Admitting: Family Medicine

## 2021-06-10 VITALS — BP 147/81 | HR 78 | Temp 97.2°F | Ht 73.0 in | Wt 333.6 lb

## 2021-06-10 DIAGNOSIS — M79671 Pain in right foot: Secondary | ICD-10-CM

## 2021-06-10 MED ORDER — DICLOFENAC SODIUM 75 MG PO TBEC: 75.0000 mg | DELAYED_RELEASE_TABLET | Freq: Two times a day (BID) | ORAL | 1 refills | Status: AC

## 2021-06-10 NOTE — Progress Notes (Signed)
   Assessment & Plan:  1. Right foot pain Rx'd diclofenac and advised not to take any other NSAIDs while taking.  - DG Foot Complete Right - diclofenac (VOLTAREN) 75 MG EC tablet; Take 1 tablet (75 mg total) by mouth 2 (two) times daily.  Dispense: 60 tablet; Refill: 1   Follow up plan: Return if symptoms worsen or fail to improve.  [name], MSN, APRN, FNP-C [facility]  Subjective:   Patient ID: Steven Romero, male    DOB: January 15, 1981, 40 y.o.   MRN: [id number]  HPI: Sequan Auxier is a 40 y.o. male presenting on 06/10/2021 for Foot Pain (Patient states he has been having side pain on right foot for a few weeks)  Patient reports pain on the lateral aspect of his right foot for the past few weeks. No injury. He does cross his feet frequently which applies pressure where the pain is located (increasing the current pain). Has has been taking Ibuprofen 800 mg 1-2x/day which has not been helpful.    ROS: Negative unless specifically indicated above in HPI.   Relevant past medical history reviewed and updated as indicated.   Allergies and medications reviewed and updated.   Current Outpatient Medications:    omeprazole (PRILOSEC) 20 MG capsule, Take 1 capsule (20 mg total) by mouth daily., Disp: 90 capsule, Rfl: 3  No Known Allergies  Objective:   BP (!) 147/81   Pulse 78   Temp (!) 97.2 F (36.2 C) (Temporal)   Ht 6\' 1"  (1.854 m)   Wt (!) 333 lb 9.6 oz (151.3 kg)   BMI 44.01 kg/m    Physical Exam Vitals reviewed.  Constitutional:      General: He is not in acute distress.    Appearance: Normal appearance. He is morbidly obese. He is not ill-appearing, toxic-appearing or diaphoretic.  HENT:     Head: Normocephalic and atraumatic.  Eyes:     General: No scleral icterus.       Right eye: No discharge.        Left eye: No discharge.     Conjunctiva/sclera: Conjunctivae normal.  Cardiovascular:     Rate and Rhythm: Normal rate.  Pulmonary:      Effort: Pulmonary effort is normal. No respiratory distress.  Musculoskeletal:        General: Normal range of motion.     Cervical back: Normal range of motion.       Feet:  Feet:     Comments: Area of pain and swelling. No erythema or warmth.  Skin:    General: Skin is warm and dry.  Neurological:     Mental Status: He is alert and oriented to person, place, and time. Mental status is at baseline.  Psychiatric:        Mood and Affect: Mood normal.        Behavior: Behavior normal.        Thought Content: Thought content normal.        Judgment: Judgment normal.

## 2021-06-14 NOTE — Progress Notes (Signed)
Patient aware and verbalizes understanding. 

## 2021-09-26 ENCOUNTER — Other Ambulatory Visit: Payer: Self-pay

## 2021-09-26 ENCOUNTER — Ambulatory Visit: Payer: BC Managed Care – PPO | Admitting: Dermatology

## 2021-09-26 ENCOUNTER — Encounter: Payer: Self-pay | Admitting: Dermatology

## 2021-09-26 DIAGNOSIS — L817 Pigmented purpuric dermatosis: Secondary | ICD-10-CM

## 2021-09-26 MED ORDER — CLOBETASOL PROP EMOLLIENT BASE 0.05 % EX CREA: TOPICAL_CREAM | CUTANEOUS | 2 refills | Status: AC

## 2021-09-26 NOTE — Patient Instructions (Addendum)
Apply topical cream after bathing. You can also apply the cream and apply a moist wrap to the legs and allow to sit for 30 minutes, then wipe cream off and reapply the cream. Allow 10 weeks.

## 2021-10-09 ENCOUNTER — Encounter: Payer: Self-pay | Admitting: Dermatology

## 2021-10-09 NOTE — Progress Notes (Signed)
     New Patient   Subjective  Steven Romero is a 40 y.o. male who presents for the following: New Patient (Initial Visit) (Patient here today for discoloration on both legs x 2 years per patient the discoloration started on his feet and now is on his legs. Per patient his PCP gave him ciclopirox to put on his legs and it didn't help. ).  Discoloration legs Location:  Duration:  Quality:  Associated Signs/Symptoms: Modifying Factors:  Severity:  Timing: Context:    The following portions of the chart were reviewed this encounter and updated as appropriate:  Tobacco  Allergies  Meds  Problems  Med Hx  Surg Hx  Fam Hx      Objective  Well appearing patient in no apparent distress; mood and affect are within normal limits. Left Lower Leg - Anterior, Right Lower Leg - Anterior Patchy macular monochrome tan pigmentation with focal areas of petechiae compatible with pigmented purpuric eruption.             A focused examination was performed including arms, hands, nailbeds, legs, feet and vascular exam.  In. Relevant physical exam findings are noted in the Assessment and Plan.   Assessment & Plan  Schamberg's purpura Left Lower Leg - Anterior; Right Lower Leg - Anterior  We discussed obtaining biopsies but this really would not alter his evaluation or treatment options, so this was deferred.  We discussed oral rutoside/ascobate but will initially try topical therapy.  Apply clobetasol daily after bathing with the option of covering with simple clean moist wrap for 30 minutes daily for 6 to 8 weeks; follow-up at that time.  No sign of tinea on his feet.  Clobetasol Prop Emollient Base (CLOBETASOL PROPIONATE E) 0.05 % emollient cream - Left Lower Leg - Anterior, Right Lower Leg - Anterior APPLY TO AFFECTED AREA AFTER BATHING.     Assessment & Plan  Schamberg's purpura Left Lower Leg - Anterior; Right Lower Leg - Anterior  We discussed obtaining biopsies but this  really would not alter his evaluation or treatment options, so this was deferred.  We discussed oral rutoside/ascobate but will initially try topical therapy.  Apply clobetasol daily after bathing with the option of covering with simple clean moist wrap for 30 minutes daily for 6 to 8 weeks; follow-up at that time.  No sign of tinea on his feet.  Clobetasol Prop Emollient Base (CLOBETASOL PROPIONATE E) 0.05 % emollient cream - Left Lower Leg - Anterior, Right Lower Leg - Anterior APPLY TO AFFECTED AREA AFTER BATHING.

## 2021-12-15 ENCOUNTER — Ambulatory Visit: Payer: BC Managed Care – PPO | Admitting: Dermatology

## 2021-12-15 DIAGNOSIS — H7192 Unspecified cholesteatoma, left ear: Secondary | ICD-10-CM | POA: Diagnosis not present

## 2021-12-15 DIAGNOSIS — H6122 Impacted cerumen, left ear: Secondary | ICD-10-CM | POA: Diagnosis not present

## 2021-12-15 DIAGNOSIS — H60339 Swimmer's ear, unspecified ear: Secondary | ICD-10-CM | POA: Diagnosis not present

## 2021-12-15 DIAGNOSIS — Z6841 Body Mass Index (BMI) 40.0 and over, adult: Secondary | ICD-10-CM | POA: Diagnosis not present

## 2022-02-02 IMAGING — DX DG FOOT COMPLETE 3+V*R*
3 series · 3 of 3 positions shown · non-contrast
Comparison: None.

CLINICAL DATA: Lateral right foot pain

EXAM:
RIGHT FOOT COMPLETE - 3+ VIEW

[foot ap]
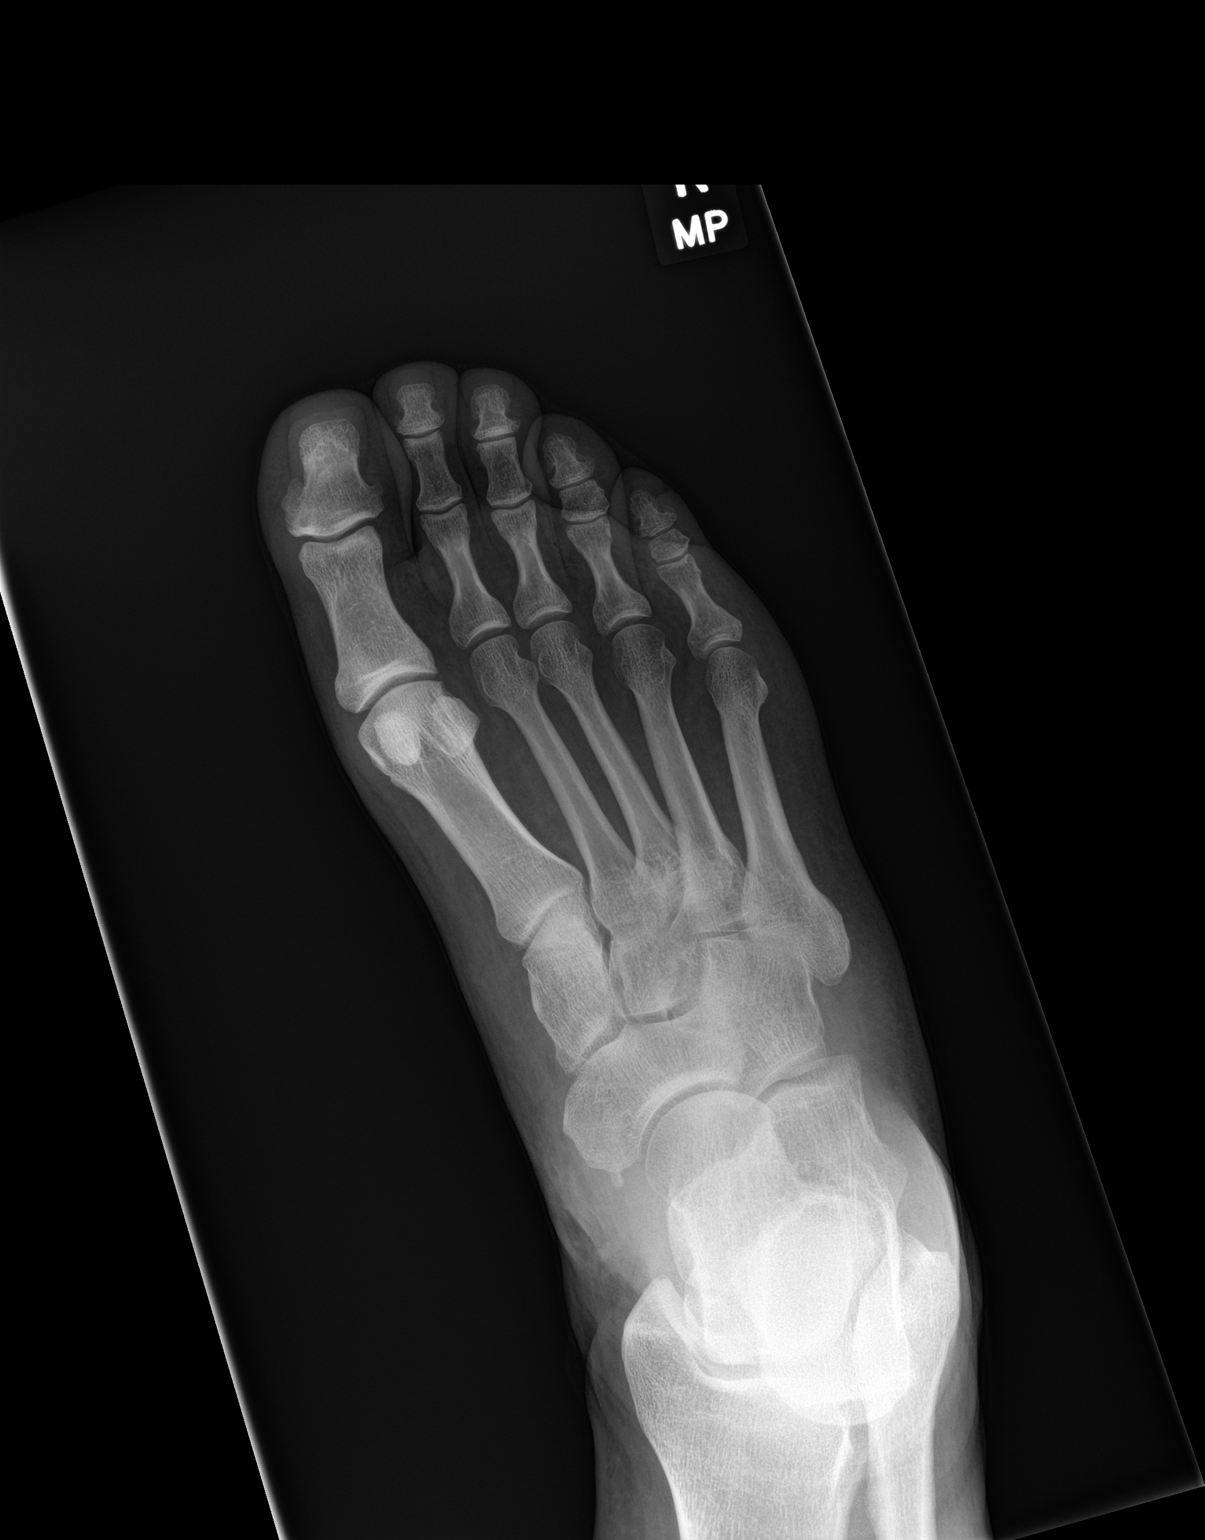

[foot obl]
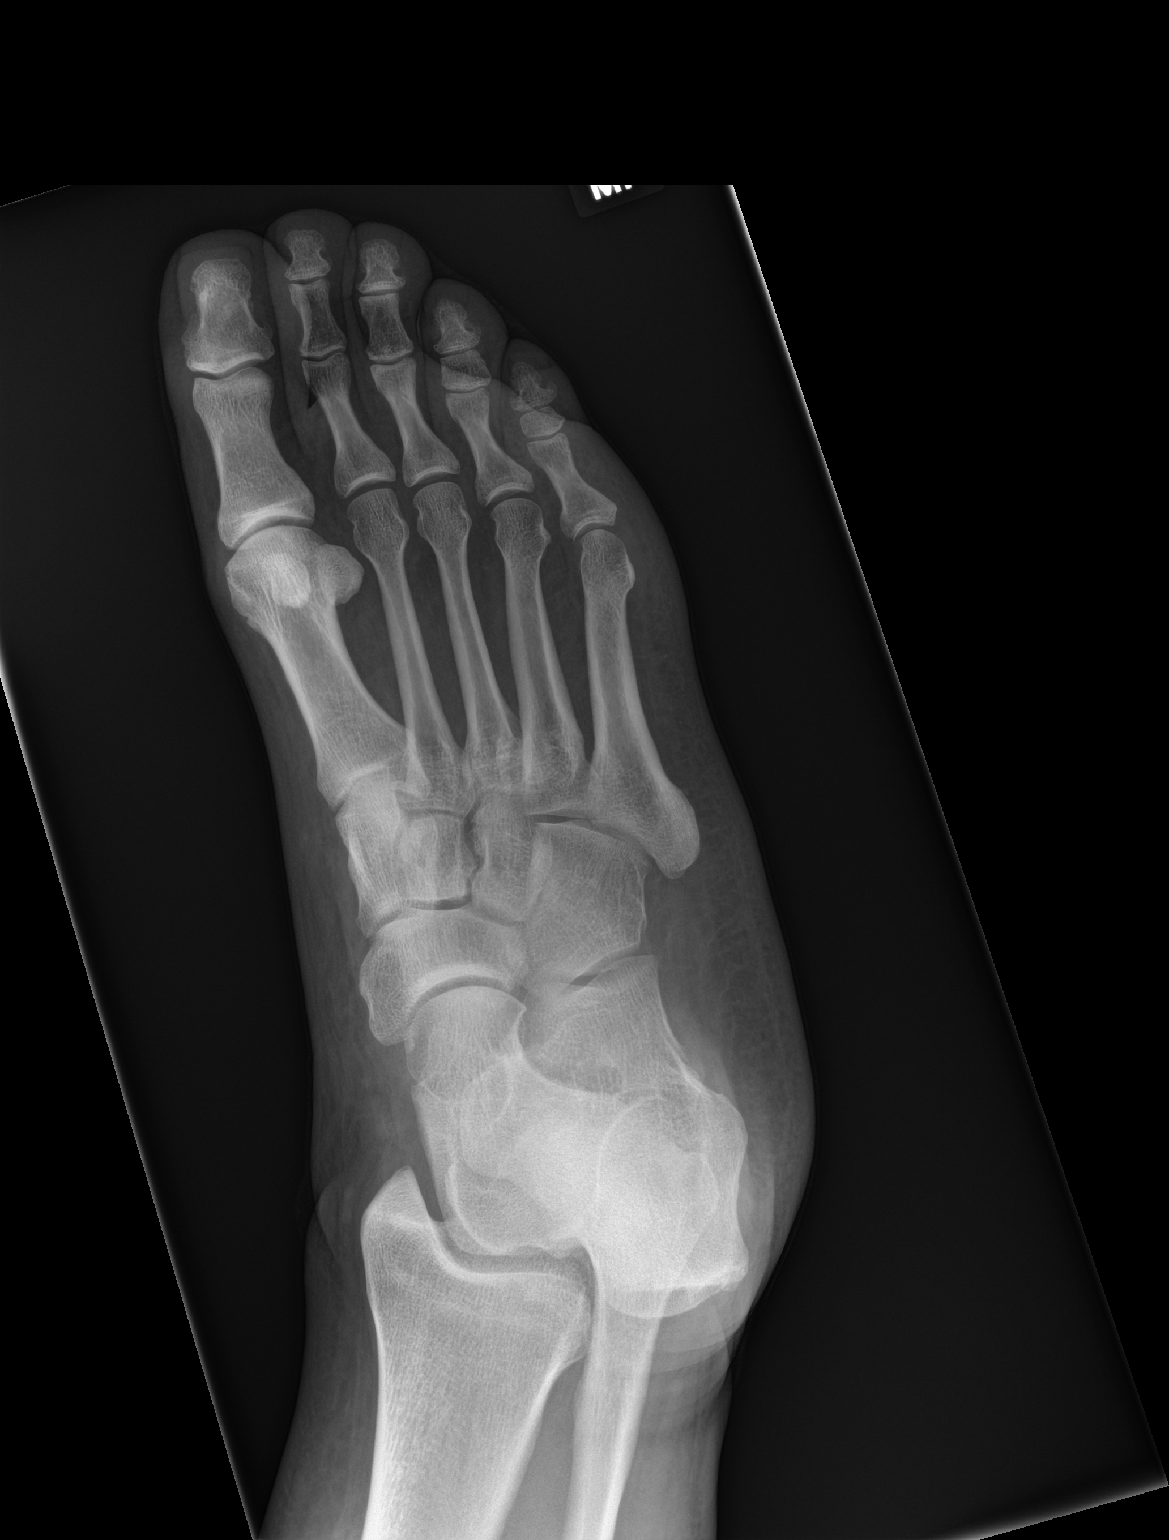

[foot lat]
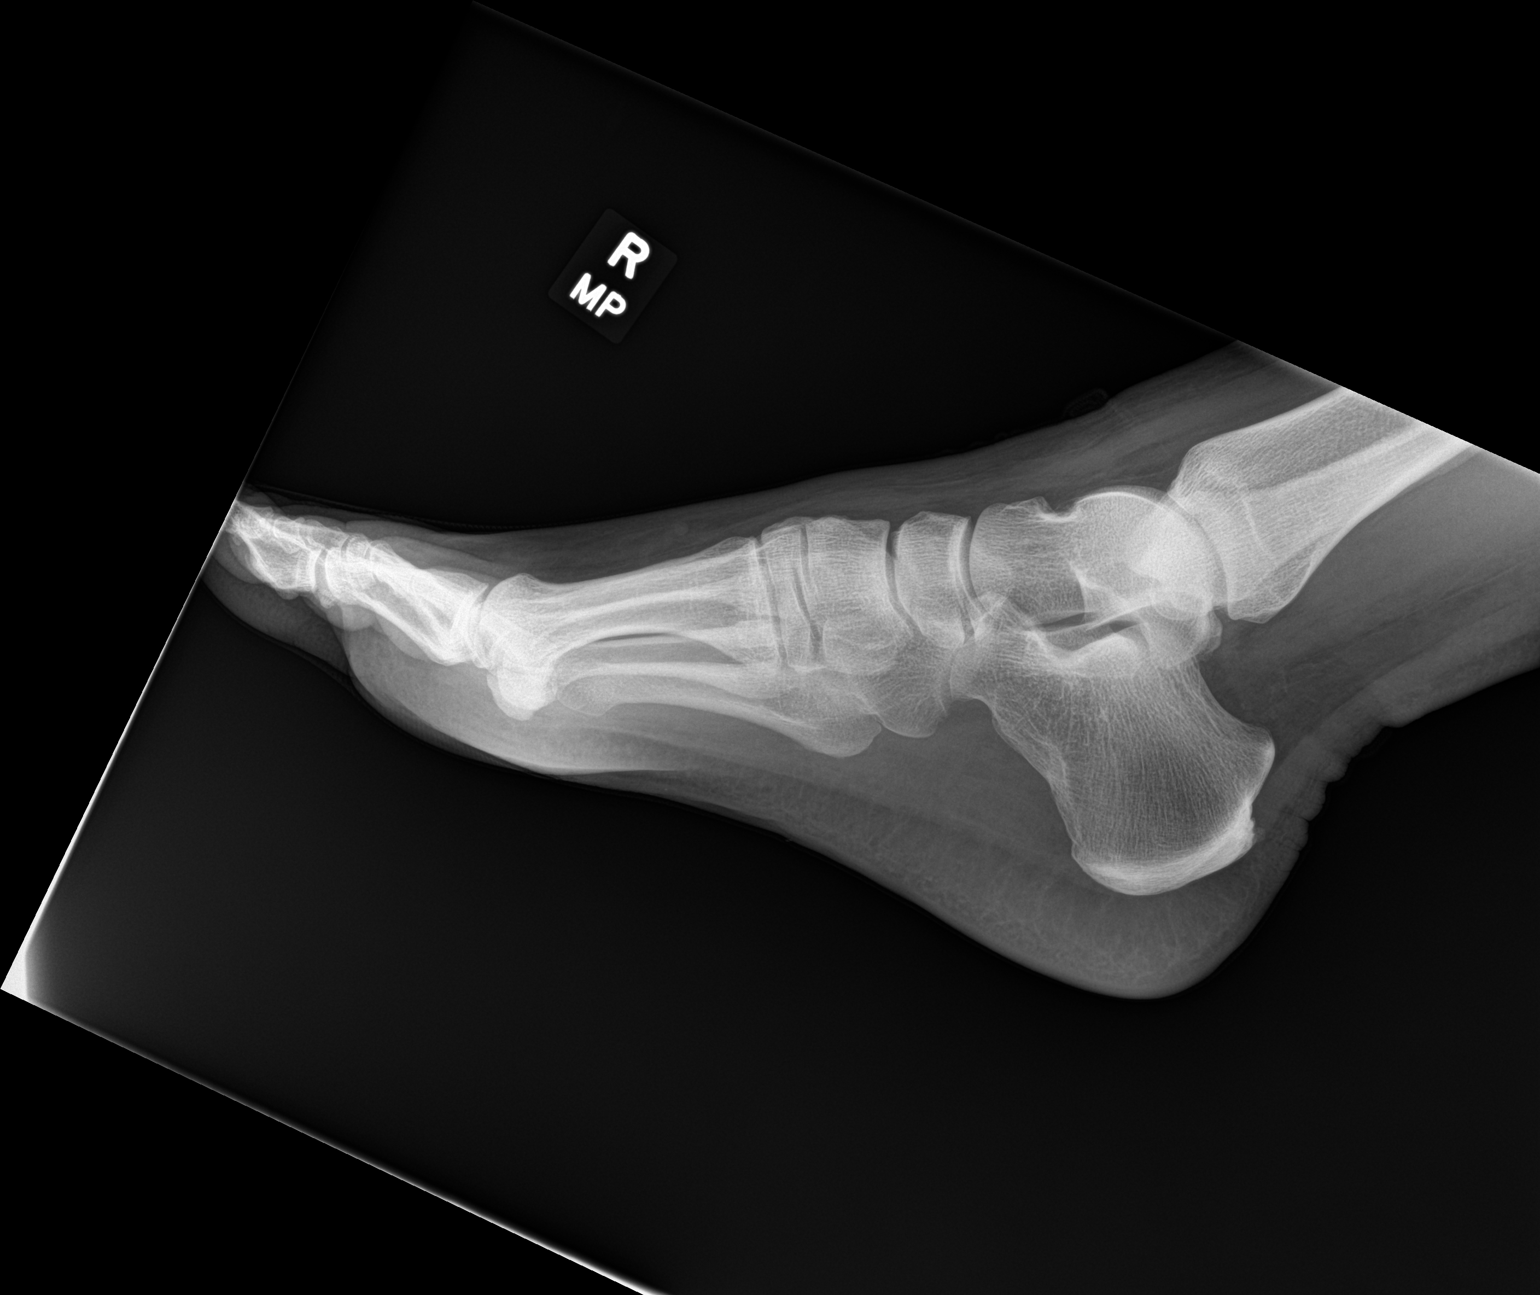

[3 of 3 positions shown; findings below may reference images not displayed]

FINDINGS: There is no evidence of fracture or dislocation. There is no
evidence of arthropathy or other focal bone abnormality. Soft
tissues are unremarkable.
IMPRESSION: No acute abnormality noted.

## 2024-10-20 ENCOUNTER — Ambulatory Visit: Payer: Self-pay

## 2024-10-20 NOTE — Telephone Encounter (Signed)
 FYI Only or Action Required?: FYI only for provider: new patient, will go to [facility] for treatment today, new patient appointment schedule for January.  Patient was last seen in primary care on new patient.  Called Nurse Triage reporting Eye Problem.  Symptoms began yesterday.  Interventions attempted: Nothing.  Symptoms are: gradually worsening.  Triage Disposition: See HCP Within 4 Hours (Or PCP Triage)  Patient/caregiver understands and will follow disposition?: Yes  Copied from CRM #[id number]. Topic: Clinical - Red Word Triage >> Oct 20, 2024  3:01 PM Antwanette L wrote: Red Word that prompted transfer to Nurse Triage: Possible pink eye.Patient reports itching, burning, discharge, and puffiness in the right eye Reason for Disposition  MODERATE eye pain or discomfort (e.g., interferes with normal activities or awakens from sleep; more than mild)  Answer Assessment - Initial Assessment Questions 1. LOCATION: Where is the redness? (e.g., eyeball or outer eyelids) Note: When callers say the eye is red, they usually mean the sclera (white of the eye) is red.      Sclera is red 2. ONE OR BOTH EYES: Is the redness in one or both eyes?      Both eyes, right worse than left 3. ONSET: When did the redness start? (e.g., hours, days)      One day ago 4. EYELIDS: Are the eyelids red or swollen? If Yes, ask: How much?      Right top of eyelid swollen 5. VISION: Do you have blurred vision?     denies 6. ITCHING: Does it feel itchy? If so ask: How bad is it (Scale 1-10; or mild, moderate, severe)     moderate 7. PAIN: Is it painful? If Yes, ask: How bad is the pain? (Scale 0-10; or none, mild, moderate, severe)     Each time patient blinks, moderate 8. CONTACT LENS: Do you wear contacts?     denies 9. CAUSE: What do you think is causing the redness?     Possible pink eye 10. OTHER SYMPTOMS: Do you have any other symptoms? (e.g., fever, runny nose, cough, vomiting)        Has had runny nose  Protocols used: Eye - Redness-A-AH

## 2024-12-16 ENCOUNTER — Ambulatory Visit: Admitting: Nurse Practitioner

## 2024-12-16 ENCOUNTER — Encounter: Payer: Self-pay | Admitting: Nurse Practitioner

## 2024-12-16 VITALS — BP 144/77 | HR 78 | Temp 98.2°F | Ht 73.0 in | Wt 348.0 lb

## 2024-12-16 DIAGNOSIS — Z6841 Body Mass Index (BMI) 40.0 and over, adult: Secondary | ICD-10-CM | POA: Diagnosis not present

## 2024-12-16 DIAGNOSIS — F332 Major depressive disorder, recurrent severe without psychotic features: Secondary | ICD-10-CM | POA: Diagnosis not present

## 2024-12-16 DIAGNOSIS — Z800 Family history of malignant neoplasm of digestive organs: Secondary | ICD-10-CM | POA: Diagnosis not present

## 2024-12-16 DIAGNOSIS — E66813 Obesity, class 3: Secondary | ICD-10-CM

## 2024-12-16 DIAGNOSIS — K219 Gastro-esophageal reflux disease without esophagitis: Secondary | ICD-10-CM | POA: Diagnosis not present

## 2024-12-16 DIAGNOSIS — Z716 Tobacco abuse counseling: Secondary | ICD-10-CM | POA: Diagnosis not present

## 2024-12-16 DIAGNOSIS — Z87891 Personal history of nicotine dependence: Secondary | ICD-10-CM | POA: Insufficient documentation

## 2024-12-16 DIAGNOSIS — E661 Drug-induced obesity: Secondary | ICD-10-CM | POA: Diagnosis not present

## 2024-12-16 DIAGNOSIS — F17200 Nicotine dependence, unspecified, uncomplicated: Secondary | ICD-10-CM

## 2024-12-16 DIAGNOSIS — R0683 Snoring: Secondary | ICD-10-CM | POA: Diagnosis not present

## 2024-12-16 DIAGNOSIS — Z0001 Encounter for general adult medical examination with abnormal findings: Secondary | ICD-10-CM

## 2024-12-16 DIAGNOSIS — Z125 Encounter for screening for malignant neoplasm of prostate: Secondary | ICD-10-CM | POA: Insufficient documentation

## 2024-12-16 LAB — LIPID PANEL

## 2024-12-16 LAB — BAYER DCA HB A1C WAIVED: HB A1C (BAYER DCA - WAIVED): 5.6 % (ref 4.8–5.6)

## 2024-12-16 MED ORDER — OMEPRAZOLE 20 MG PO CPDR: 20.0000 mg | DELAYED_RELEASE_CAPSULE | Freq: Every day | ORAL | 3 refills | Status: AC

## 2024-12-16 NOTE — Progress Notes (Signed)
 "    Subjective:  Patient ID: Steven Romero, male    DOB: 1981-10-24, 44 y.o.   MRN: [id number]  Patient Care Team: Deitra Morton Hummer, Nena, NP as PCP - General (Nurse Practitioner) Livingston Rigg, MD as Consulting Physician (Dermatology)   Chief Complaint:  Establish Care (Ongoing heart burn)   HPI: Steven Romero is a 44 y.o. male presenting on 12/16/2024 for Establish Care (Ongoing heart burn)   Discussed the use of AI scribe software for clinical note transcription with the patient, who gave verbal consent to proceed.  History of Present Illness Steven Romero is a 44 year old male who presents with heartburn and requests a colonoscopy.  He experiences recurrent heartburn, which was previously well-managed with medication.  He requests a colonoscopy due to a family history of colon cancer, specifically his father. He has never undergone a colonoscopy before.  He has been experiencing a rough couple of months, scoring thirteen out of twenty on a depression screening. He attributes this to stressors including his mother's cancer, his father-in-law's open heart surgery in October, and his wife's recent ankle surgery. He has been caring for his wife post-surgery, impacting his work schedule and stress levels.  He reports chronic sleep issues, working night shifts with a three-hour round trip commute, limiting his sleep to three to six hours per day. He attributes his sleep difficulties to his work schedule. No history of sleep apnea, but he mentions snoring when congested. His father also has sleep apnea.  He smokes a pack of cigarettes a day and has been smoking for thirty years. He wants to lose weight and had lost almost fifty pounds before the holidays.    Flowsheet Row Office Visit from 12/16/2024 in [facility]  PHQ-9 Total Score 13       12/16/2024    3:36 PM 06/10/2021   10:42 AM  GAD 7 : Generalized Anxiety Score  Nervous, Anxious, on Edge 2 1   Control/stop worrying 2 0  Worry too much - different things 2 1  Trouble relaxing 2 0  Restless 2 0  Easily annoyed or irritable 0 3  Afraid - awful might happen 2 2  Total GAD 7 Score 12 7  Anxiety Difficulty Somewhat difficult Somewhat difficult     Relevant past medical, surgical, family, and social history reviewed and updated as indicated.  Allergies and medications reviewed and updated. Data reviewed: Chart in Epic.   Past Medical History:  Diagnosis Date   Hyperlipidemia     Past Surgical History:  Procedure Laterality Date   BACK SURGERY     INNER EAR SURGERY Left    rebuilt ear cancl 2023    Social History   Socioeconomic History   Marital status: Married    Spouse name: Not on file   Number of children: Not on file   Years of education: Not on file   Highest education level: Not on file  Occupational History   Not on file  Tobacco Use   Smoking status: Every Day    Current packs/day: 1.00    Types: Cigarettes   Smokeless tobacco: Never  Vaping Use   Vaping status: Former  Substance and Sexual Activity   Alcohol use: Yes    Comment: occ   Drug use: Yes    Types: Marijuana   Sexual activity: Not on file  Other Topics Concern   Not on file  Social History Narrative   Not on file  Social Drivers of Health   Tobacco Use: High Risk (12/16/2024)   Patient History    Smoking Tobacco Use: Every Day    Smokeless Tobacco Use: Never    Passive Exposure: Not on file  Financial Resource Strain: Not on file  Food Insecurity: No Food Insecurity (10/20/2024)   Received from [facility]   Epic    Within the past 12 months, you worried that your food would run out before you got the money to buy more.: Never true    Within the past 12 months, the food you bought just didn't last and you didn't have money to get more.: Never true  Transportation Needs: No Transportation Needs (10/20/2024)   Received from [facility] - Transportation     Lack of Transportation (Medical): No    Lack of Transportation (Non-Medical): No  Physical Activity: Not on file  Stress: Not on file  Social Connections: Not on file  Intimate Partner Violence: Not on file  Depression (PHQ2-9): High Risk (12/16/2024)   Depression (PHQ2-9)    PHQ-2 Score: 13  Alcohol Screen: Not on file  Housing: Not on file  Utilities: Low Risk (10/20/2024)   Received [facility]   Utilities    Within the past 12 months, have you been unable to get utilities(heat, electricity) when it was really needed?: No  Health Literacy: Not on file    Outpatient Encounter Medications as of 12/16/2024  Medication Sig   Clobetasol  Prop Emollient Base (CLOBETASOL  PROPIONATE E) 0.05 % emollient cream APPLY TO AFFECTED AREA AFTER BATHING. (Patient not taking: Reported on 12/16/2024)   diclofenac  (VOLTAREN ) 75 MG EC tablet Take 1 tablet (75 mg total) by mouth 2 (two) times daily. (Patient not taking: Reported on 12/16/2024)   ibuprofen (ADVIL) 200 MG tablet Take 200 mg by mouth as needed.   omeprazole  (PRILOSEC) 20 MG capsule Take 1 capsule (20 mg total) by mouth daily.   [DISCONTINUED] omeprazole  (PRILOSEC) 20 MG capsule Take 1 capsule (20 mg total) by mouth daily.   No facility-administered encounter medications on file as of 12/16/2024.    Allergies[1]  Pertinent ROS per HPI, otherwise unremarkable      Objective:  BP (!) 144/77   Pulse 78   Temp 98.2 F (36.8 C)   Ht 6' 1 (1.854 m)   Wt (!) 348 lb (157.9 kg)   SpO2 95%   BMI 45.91 kg/m    Wt Readings from Last 3 Encounters:  12/16/24 (!) 348 lb (157.9 kg)  06/10/21 (!) 333 lb 9.6 oz (151.3 kg)  04/04/21 (!) 341 lb 1.6 oz (154.7 kg)    Physical Exam Vitals and nursing note reviewed.  Constitutional:      General: He is not in acute distress.    Appearance: He is morbidly obese.  HENT:     Head: Normocephalic and atraumatic.     Right Ear: Tympanic membrane, ear canal and external ear normal. There is  no impacted cerumen.     Left Ear: Tympanic membrane, ear canal and external ear normal. There is no impacted cerumen.     Nose: Nose normal.     Mouth/Throat:     Mouth: Mucous membranes are moist.  Eyes:     General: No scleral icterus.    Extraocular Movements: Extraocular movements intact.     Conjunctiva/sclera: Conjunctivae normal.     Pupils: Pupils are equal, round, and reactive to light.  Neck:  Vascular: No carotid bruit.  Cardiovascular:     Heart sounds: Normal heart sounds.  Pulmonary:     Effort: Pulmonary effort is normal.     Breath sounds: Normal breath sounds.  Abdominal:     General: Bowel sounds are normal.  Musculoskeletal:        General: Normal range of motion.     Cervical back: Normal range of motion and neck supple. No rigidity or tenderness.     Right lower leg: No edema.     Left lower leg: No edema.  Lymphadenopathy:     Cervical: No cervical adenopathy.  Skin:    General: Skin is warm and dry.     Findings: No rash.  Neurological:     Mental Status: He is alert and oriented to person, place, and time.  Psychiatric:        Mood and Affect: Mood normal.        Speech: Speech normal.        Behavior: Behavior normal. Behavior is cooperative.        Thought Content: Thought content normal.        Judgment: Judgment normal.    Physical Exam      Results for orders placed or performed in visit on 04/04/21  CMP14+EGFR   Collection Time: 04/04/21  8:36 AM  Result Value Ref Range   Glucose 108 (H) 65 - 99 mg/dL   BUN 14 6 - 20 mg/dL   Creatinine, Ser 8.96 0.76 - 1.27 mg/dL   eGFR 95 >40 fO/fpw/8.26   BUN/Creatinine Ratio 14 9 - 20   Sodium 143 134 - 144 mmol/L   Potassium 4.3 3.5 - 5.2 mmol/L   Chloride 105 96 - 106 mmol/L   CO2 23 20 - 29 mmol/L   Calcium 9.4 8.7 - 10.2 mg/dL   Total Protein 6.5 6.0 - 8.5 g/dL   Albumin 4.6 4.0 - 5.0 g/dL   Globulin, Total 1.9 1.5 - 4.5 g/dL   Albumin/Globulin Ratio 2.4 (H) 1.2 - 2.2   Bilirubin  Total 0.4 0.0 - 1.2 mg/dL   Alkaline Phosphatase 87 44 - 121 IU/L   AST 33 0 - 40 IU/L   ALT 77 (H) 0 - 44 IU/L  Lipid panel   Collection Time: 04/04/21  8:36 AM  Result Value Ref Range   Cholesterol, Total 237 (H) 100 - 199 mg/dL   Triglycerides 744 (H) 0 - 149 mg/dL   HDL 29 (L) >60 mg/dL   VLDL Cholesterol Cal 48 (H) 5 - 40 mg/dL   LDL Chol Calc (NIH) 839 (H) 0 - 99 mg/dL   Chol/HDL Ratio 8.2 (H) 0.0 - 5.0 ratio       Pertinent labs & imaging results that were available during my care of the patient were reviewed by me and considered in my medical decision making.  Assessment & Plan:  Steven Romero was seen today for establish care.  Diagnoses and all orders for this visit:  Encounter for general adult medical examination with abnormal findings -     CBC with Differential/Platelet -     CMP14+EGFR -     Lipid panel -     Thyroid  Panel With TSH -     Bayer DCA Hb A1c Waived -     PSA, total and free -     omeprazole  (PRILOSEC) 20 MG capsule; Take 1 capsule (20 mg total) by mouth daily. -     Ambulatory referral to Gastroenterology -  Ambulatory referral to Sleep Studies -     Home sleep test  Gastroesophageal reflux disease, unspecified whether esophagitis present -     CBC with Differential/Platelet -     omeprazole  (PRILOSEC) 20 MG capsule; Take 1 capsule (20 mg total) by mouth daily.  Snoring -     Ambulatory referral to Sleep Studies -     Home sleep test  Class 3 drug-induced obesity with serious comorbidity and body mass index (BMI) of 45.0 to 49.9 in adult (HCC) -     Lipid panel -     Thyroid  Panel With TSH -     Bayer DCA Hb A1c Waived  Family history of colon cancer in father -     Ambulatory referral to Gastroenterology  Screening PSA (prostate specific antigen)  Tobacco dependence  History of smoking 10-25 pack years  Tobacco abuse counseling  Severe episode of recurrent major depressive disorder, without psychotic features (HCC)     Assessment  and Plan Steven Romero is a 44 year old Caucasian male seen today to establish care, no acute distress Assessment & Plan Gastroesophageal reflux disease Recurrent heartburn managed with medication. - Prescribed acid reflux medication with three refills.  Obesity, class 3 Class 3 obesity with interest in [facility] for weight loss, pending sleep apnea diagnosis. Discussed side effects and cost implications. - Referred for sleep study to evaluate for obstructive sleep apnea. - Discussed potential use of Wegovy for weight loss if sleep apnea is diagnosed.  Tobacco dependence Long-term tobacco use, smoking one pack per day for 30 years. Smoking/Tobacco Cessation Counseling Steven Romero is a current user of tobacco or nicotine products. He is not ready to quit at this time. Counseling provided today addressed the risks of continued use and the benefits of cessation. Discussed tobacco/nicotine use history, readiness to quit, and evidence-based treatment options including behavioral strategies, support resources, and pharmacologic therapies. Provided encouragement and educational materials on steps and resources to quit smoking. Patient questions were addressed, and follow-up recommended for continued support. Total time spent on counseling: 4 minutes.   Obstructive sleep apnea screening Screening discussed for potential weight loss medication coverage. - Referred for home-based sleep study.  General Health Maintenance Declined COVID and flu vaccines. Family history of colon cancer requires colonoscopy. - Ordered CBC, CMP, liver function, thyroid  function, and prostate blood work. - Referred to GI for colonoscopy scheduling.      Continue all other maintenance medications.  Follow up plan: Return for chronic diseases management.   Continue healthy lifestyle choices, including diet (rich in fruits, vegetables, and lean proteins, and low in salt and simple carbohydrates) and exercise (at least 30  minutes of moderate physical activity daily).  Educational handout given for   Clinical References  BMI for Adults Body mass index (BMI) is a number found using a person's weight and height. BMI can help tell how much of a person's weight is made up of fat. BMI does not measure body fat directly. It is used instead of tests that directly measure body fat, which can be difficult and expensive. What are BMI measurements used for? BMI is useful to: Find out if your weight puts you at higher risk for medical problems. Help recommend changes, such as in diet and exercise. This can help you reach a healthy weight. BMI screening can be done again to see if these changes are working. How is BMI calculated? Your height and weight are measured. The BMI is found from those numbers. This can be done  with U[place] or metric measurements. Note that charts and online BMI calculators are available to help you find your BMI quickly and easily without doing these calculations. To calculate your BMI in U.[place] measurements: Measure your weight in pounds (lb). Multiply the number of pounds by 703. So, for an adult who weighs 150 lb, multiply that number by 703: 150 x 703, which equals 105,450. Measure your height in inches. Then multiply that number by itself to get a measurement called inches squared. So, for an adult who is 70 inches tall, the inches squared measurement is 70 inches x 70 inches, which equals 4,900 inches squared. Divide the total from step 2 (number of lb x 703) by the total from step 3 (inches squared): 105,450  4,900 = 21.5. This is your BMI. To calculate your BMI in metric measurements:  Measure your weight in kilograms (kg). For this example, the weight is 70 kg. Measure your height in meters (m). Then multiply that number by itself to get a measurement called meters squared. So, for an adult who is 1.75 m tall, the meters squared measurement is 1.75 m x 1.75 m, which equals 3.1 meters  squared. Divide the number of kilograms (your weight) by the meters squared number. In this example: 70  3.1 = 22.6. This is your BMI. What do the results mean? BMI charts are used to see if you are underweight, normal weight, overweight, or obese. The following guidelines will be used: Underweight: BMI less than 18.5. Normal weight: BMI between 18.5 and 24.9. Overweight: BMI between 25 and 29.9. Obese: BMI of 30 or above. BMI is a tool and cannot diagnose a condition. Talk with your health care provider about what your BMI means for you. Keep these notes in mind: Weight includes fat and muscle. Someone with a muscular build, such as an athlete, may have a BMI that is higher than 24.9. In cases like these, BMI is not a correct measure of body fat. If you have a BMI of 25 or higher, your provider may need to do more testing to find out if excess body fat is the cause. BMI is measured the same way for males and females. Females usually have more body fat than males of the same height and weight. Where to find more information For more information about BMI, including tools to quickly find your BMI, go to: Centers for Disease Control and Prevention: tonerpromos.no American Heart Association: heart.org National Heart, Lung, and Blood Institute: buffalodrycleaner.gl This information is not intended to replace advice given to you by your health care provider. Make sure you discuss any questions you have with your health care provider. Document Revised: 08/10/2022 Document Reviewed: 08/03/2022 Elsevier Patient Education  2024 Elsevier Inc. Obesity, Adult Obesity is the condition of having too much total body fat. Being overweight or obese means that your weight is greater than what is considered healthy for your body size. Obesity is determined by a measurement called BMI (body mass index). BMI is an estimate of body fat and is calculated from height and weight. For adults, a BMI of 30 or higher is considered  obese. Obesity can lead to other health concerns and major illnesses, including: Stroke. Coronary artery disease (CAD). Type 2 diabetes. Some types of cancer, including cancers of the colon, breast, uterus, and gallbladder. High blood pressure (hypertension). High cholesterol. Gallbladder stones. Obesity can also contribute to: Osteoarthritis. Sleep apnea. Infertility problems. What are the causes? Common causes of this condition include: Eating daily  meals that are high in calories, sugar, and fat. Drinking high amounts of sugar-sweetened beverages, such as soft drinks. Being born with genes that may make you more likely to become obese. Having a medical condition that causes obesity, including: Hypothyroidism. Polycystic ovarian syndrome (PCOS). Binge-eating disorder. Cushing syndrome. Taking certain medicines, such as steroids, antidepressants, and seizure medicines. Not being physically active (sedentary lifestyle). Not getting enough sleep. What increases the risk? The following factors may make you more likely to develop this condition: Having a family history of obesity. Living in an area with limited access to: [place], recreation centers, or sidewalks. Healthy food choices, such as grocery stores and farmers' markets. What are the signs or symptoms? The main sign of this condition is having too much body fat. How is this diagnosed? This condition is diagnosed based on: Your BMI. If you are an adult with a BMI of 30 or higher, you are considered obese. Your waist circumference. This measures the distance around your waistline. Your skinfold thickness. Your health care provider may gently pinch a fold of your skin and measure it. You may have other tests to check for underlying conditions. How is this treated? Treatment for this condition often includes changing your lifestyle. Treatment may include some or all of the following: Dietary changes. This may include  developing a healthy meal plan. Regular physical activity. This may include activity that causes your heart to beat faster (aerobic exercise) and strength training. Work with your health care provider to design an exercise program that works for you. Medicine to help you lose weight if you are unable to lose one pound a week after six weeks of healthy eating and more physical activity. Treating conditions that cause the obesity (underlying conditions). Surgery. Surgical options may include gastric banding and gastric bypass. Surgery may be done if: Other treatments have not helped to improve your condition. You have a BMI of 40 or higher. You have life-threatening health problems related to obesity. Follow these instructions at home: Eating and drinking  Follow recommendations from your health care provider about what you eat and drink. Your health care provider may advise you to: Limit fast food, sweets, and processed snack foods. Choose low-fat options, such as low-fat milk instead of whole milk. Eat five or more servings of fruits or vegetables every day. Choose healthy foods when you eat out. Keep low-fat snacks available. Limit sugary drinks, such as soda, fruit juice, sweetened iced tea, and flavored milk. Drink enough water to keep your urine pale yellow. Do not follow a fad diet. Fad diets can be unhealthy and even dangerous. Other healthful choices include: Eat at home more often. This gives you more control over what you eat. Learn to read food labels. This will help you understand how much food is considered one serving. Learn what a healthy serving size is. Physical activity Exercise regularly, as told by your health care provider. Most adults should get up to 150 minutes of moderate-intensity exercise every week. Ask your health care provider what types of exercise are safe for you and how often you should exercise. Warm up and stretch before being active. Cool down and  stretch after being active. Rest between periods of activity. Lifestyle Work with your health care provider and a dietitian to set a weight-loss goal that is healthy and reasonable for you. Limit your screen time. Find ways to reward yourself that do not involve food. Do not drink alcohol if: Your health care provider tells you not  to drink. You are pregnant, may be pregnant, or are planning to become pregnant. If you drink alcohol: Limit how much you have to: 0-1 drink a day for women. 0-2 drinks a day for men. Know how much alcohol is in your drink. In the [place] one drink equals one 12 oz bottle of beer (355 mL), one 5 oz glass of wine (148 mL), or one 1 oz glass of hard liquor (44 mL). General instructions Keep a weight-loss journal to keep track of the food you eat and how much exercise you get. Take over-the-counter and prescription medicines only as told by your health care provider. Take vitamins and supplements only as told by your health care provider. Consider joining a support group. Your health care provider may be able to recommend a support group. Pay attention to your mental health as obesity can lead to depression or self esteem issues. Keep all follow-up visits. This is important. Contact a health care provider if: You are unable to meet your weight-loss goal after six weeks of dietary and lifestyle changes. You have trouble breathing. Summary Obesity is the condition of having too much total body fat. Being overweight or obese means that your weight is greater than what is considered healthy for your body size. Work with your health care provider and a dietitian to set a weight-loss goal that is healthy and reasonable for you. Exercise regularly, as told by your health care provider. Ask your health care provider what types of exercise are safe for you and how often you should exercise. This information is not intended to replace advice given to you by your health care  provider. Make sure you discuss any questions you have with your health care provider. Document Revised: 06/28/2021 Document Reviewed: 06/28/2021 Elsevier Patient Education  2024 Elsevier Inc. Health Maintenance, Male Adopting a healthy lifestyle and getting preventive care are important in promoting health and wellness. Ask your health care provider about: The right schedule for you to have regular tests and exams. Things you can do on your own to prevent diseases and keep yourself healthy. What should I know about diet, weight, and exercise? Eat a healthy diet  Eat a diet that includes plenty of vegetables, fruits, low-fat dairy products, and lean protein. Do not eat a lot of foods that are high in solid fats, added sugars, or sodium. Maintain a healthy weight Body mass index (BMI) is a measurement that can be used to identify possible weight problems. It estimates body fat based on height and weight. Your health care provider can help determine your BMI and help you achieve or maintain a healthy weight. Get regular exercise Get regular exercise. This is one of the most important things you can do for your health. Most adults should: Exercise for at least 150 minutes each week. The exercise should increase your heart rate and make you sweat (moderate-intensity exercise). Do strengthening exercises at least twice a week. This is in addition to the moderate-intensity exercise. Spend less time sitting. Even light physical activity can be beneficial. Watch cholesterol and blood lipids Have your blood tested for lipids and cholesterol at 44 years of age, then have this test every 5 years. You may need to have your cholesterol levels checked more often if: Your lipid or cholesterol levels are high. You are older than 44 years of age. You are at high risk for heart disease. What should I know about cancer screening? Many types of cancers can be detected early and may  often be prevented.  Depending on your health history and family history, you may need to have cancer screening at various ages. This may include screening for: Colorectal cancer. Prostate cancer. Skin cancer. Lung cancer. What should I know about heart disease, diabetes, and high blood pressure? Blood pressure and heart disease High blood pressure causes heart disease and increases the risk of stroke. This is more likely to develop in Romero who have high blood pressure readings or are overweight. Talk with your health care provider about your target blood pressure readings. Have your blood pressure checked: Every 3-5 years if you are 46-22 years of age. Every year if you are 75 years old or older. If you are between the ages of 59 and 29 and are a current or former smoker, ask your health care provider if you should have a one-time screening for abdominal aortic aneurysm (AAA). Diabetes Have regular diabetes screenings. This checks your fasting blood sugar level. Have the screening done: Once every three years after age 61 if you are at a normal weight and have a low risk for diabetes. More often and at a younger age if you are overweight or have a high risk for diabetes. What should I know about preventing infection? Hepatitis B If you have a higher risk for hepatitis B, you should be screened for this virus. Talk with your health care provider to find out if you are at risk for hepatitis B infection. Hepatitis C Blood testing is recommended for: Everyone born from [birth year] through 1965. Anyone with known risk factors for hepatitis C. Sexually transmitted infections (STIs) You should be screened each year for STIs, including gonorrhea and chlamydia, if: You are sexually active and are younger than 44 years of age. You are older than 44 years of age and your health care provider tells you that you are at risk for this type of infection. Your sexual activity has changed since you were last screened, and you are  at increased risk for chlamydia or gonorrhea. Ask your health care provider if you are at risk. Ask your health care provider about whether you are at high risk for HIV. Your health care provider may recommend a prescription medicine to help prevent HIV infection. If you choose to take medicine to prevent HIV, you should first get tested for HIV. You should then be tested every 3 months for as long as you are taking the medicine. Follow these instructions at home: Alcohol use Do not drink alcohol if your health care provider tells you not to drink. If you drink alcohol: Limit how much you have to 0-2 drinks a day. Know how much alcohol is in your drink. In the [place] one drink equals one 12 oz bottle of beer (355 mL), one 5 oz glass of wine (148 mL), or one 1 oz glass of hard liquor (44 mL). Lifestyle Do not use any products that contain nicotine or tobacco. These products include cigarettes, chewing tobacco, and vaping devices, such as e-cigarettes. If you need help quitting, ask your health care provider. Do not use street drugs. Do not share needles. Ask your health care provider for help if you need support or information about quitting drugs. General instructions Schedule regular health, dental, and eye exams. Stay current with your vaccines. Tell your health care provider if: You often feel depressed. You have ever been abused or do not feel safe at home. Summary Adopting a healthy lifestyle and getting preventive care are important in promoting health  and wellness. Follow your health care provider's instructions about healthy diet, exercising, and getting tested or screened for diseases. Follow your health care provider's instructions on monitoring your cholesterol and blood pressure. This information is not intended to replace advice given to you by your health care provider. Make sure you discuss any questions you have with your health care provider. Document Revised: 04/11/2021  Document Reviewed: 04/11/2021 Elsevier Patient Education  2024 [employer]. Preventive Care 41-17 Years Old, Male Preventive care refers to lifestyle choices and visits with your health care provider that can promote health and wellness. Preventive care visits are also called wellness exams. What can I expect for my preventive care visit? Counseling During your preventive care visit, your health care provider may ask about your: Medical history, including: Past medical problems. Family medical history. Current health, including: Emotional well-being. Home life and relationship well-being. Sexual activity. Lifestyle, including: Alcohol, nicotine or tobacco, and drug use. Access to firearms. Diet, exercise, and sleep habits. Safety issues such as seatbelt and bike helmet use. Sunscreen use. Work and work astronomer. Physical exam Your health care provider will check your: Height and weight. These may be used to calculate your BMI (body mass index). BMI is a measurement that tells if you are at a healthy weight. Waist circumference. This measures the distance around your waistline. This measurement also tells if you are at a healthy weight and may help predict your risk of certain diseases, such as type 2 diabetes and high blood pressure. Heart rate and blood pressure. Body temperature. Skin for abnormal spots. What immunizations do I need?  Vaccines are usually given at various ages, according to a schedule. Your health care provider will recommend vaccines for you based on your age, medical history, and lifestyle or other factors, such as travel or where you work. What tests do I need? Screening Your health care provider may recommend screening tests for certain conditions. This may include: Lipid and cholesterol levels. Diabetes screening. This is done by checking your blood sugar (glucose) after you have not eaten for a while (fasting). Hepatitis B test. Hepatitis C  test. HIV (human immunodeficiency virus) test. STI (sexually transmitted infection) testing, if you are at risk. Lung cancer screening. Prostate cancer screening. Colorectal cancer screening. Talk with your health care provider about your test results, treatment options, and if necessary, the need for more tests. Follow these instructions at home: Eating and drinking  Eat a diet that includes fresh fruits and vegetables, whole grains, lean protein, and low-fat dairy products. Take vitamin and mineral supplements as recommended by your health care provider. Do not drink alcohol if your health care provider tells you not to drink. If you drink alcohol: Limit how much you have to 0-2 drinks a day. Know how much alcohol is in your drink. In the [place] one drink equals one 12 oz bottle of beer (355 mL), one 5 oz glass of wine (148 mL), or one 1 oz glass of hard liquor (44 mL). Lifestyle Brush your teeth every morning and night with fluoride toothpaste. Floss one time each day. Exercise for at least 30 minutes 5 or more days each week. Do not use any products that contain nicotine or tobacco. These products include cigarettes, chewing tobacco, and vaping devices, such as e-cigarettes. If you need help quitting, ask your health care provider. Do not use drugs. If you are sexually active, practice safe sex. Use a condom or other form of protection to prevent STIs. Take aspirin only  as told by your health care provider. Make sure that you understand how much to take and what form to take. Work with your health care provider to find out whether it is safe and beneficial for you to take aspirin daily. Find healthy ways to manage stress, such as: Meditation, yoga, or listening to music. Journaling. Talking to a trusted person. Spending time with friends and family. Minimize exposure to UV radiation to reduce your risk of skin cancer. Safety Always wear your seat belt while driving or riding in a  vehicle. Do not drive: If you have been drinking alcohol. Do not ride with someone who has been drinking. When you are tired or distracted. While texting. If you have been using any mind-altering substances or drugs. Wear a helmet and other protective equipment during sports activities. If you have firearms in your house, make sure you follow all gun safety procedures. What's next? Go to your health care provider once a year for an annual wellness visit. Ask your health care provider how often you should have your eyes and teeth checked. Stay up to date on all vaccines. This information is not intended to replace advice given to you by your health care provider. Make sure you discuss any questions you have with your health care provider. Document Revised: 05/18/2021 Document Reviewed: 05/18/2021 Elsevier Patient Education  2024 [employer] Colorectal Cancer Screening: What to Know  Colorectal cancer screening is a group of tests used to check for colorectal cancer. These tests help find cancer before you have any symptoms. Colorectal refers to the colon and rectum, which are at the end of the digestive tract and remove waste from the body. Who should have screening? All adults who are 48-61 years old should have screening. Your health care provider may suggest starting screening even before you turn 45. How often you need these tests will depend on your results and the type of test you have. This will range from every 1-10 years. If you're between 42 and 79 years old, the suggested screening may vary based on your health. Once you're older than 85, you don't need to have this screening anymore. You may need to start screening earlier than age 59, or have tests more often, if you have any of these risk factors: A personal or family history of colorectal cancer or abnormal growths (polyps) in the colon. Inflammatory bowel disease, such as ulcerative colitis or Crohn's  disease. Diabetes. Past radiation treatment to your belly or pelvic area for cancer. Certain conditions that are passed down in families, such as: Lynch syndrome. Familial adenomatous polyposis. Turcot syndrome. Peutz-Jeghers syndrome. MUTYH-associated polyposis (MAP). Cystic fibrosis (CF). Types of tests There are many types of tests to screen for colorectal cancer. You may have one or more of these tests: Guaiac-based fecal occult blood testing. This test checks a poop (stool) sample for hidden blood, which could be a sign of colorectal cancer. Fecal immunochemical test (FIT). This test checks a poop sample for blood, which could be a sign of colorectal cancer. Stool DNA test. For this test, a poop sample is checked for blood and changes in DNA that could lead to colorectal cancer. Sigmoidoscopy. This test is done to view the inside of the rectum and lower colon. It's done using a sigmoidoscope, which is a flexible tube with a camera on the end. Colonoscopy. This test is done to view the entire colon and rectum. It uses a colonoscope, which is a flexible tube with a camera.  Sometimes tissue samples are taken for testing (biopsy) or small polyps are removed during this test. Virtual colonoscopy. This test uses a CT scan to take pictures of the colon and rectum instead of using a colonoscope. A CT scan is a type of X-ray. What are the benefits of screening? Screening can reduce your chances of getting colorectal cancer. It also helps find cancer at an early stage, when the cancer can be removed or treated more easily. It's common for polyps to form in the lining of the colon, especially as you age. These polyps may be cancer or may turn into cancer over time. Screening can spot these polyps early. What are the risks of screening? Your provider will talk with you about risks. These may include: The need for more tests to confirm results from a stool sample test. Stool sample tests have fewer  risks than other types of screening tests. Being exposed to low levels of radiation if you had a test that uses X-rays. This may slightly raise your cancer risk. The benefit of finding cancer outweighs the slight increase in risk. Risks when having a sigmoidoscopy or colonoscopy, such as: Bleeding or damage to the intestine. Infection. A reaction to medicines given during the test. Talk with your provider about your risk for colorectal cancer. This will help you make a screening plan that's right for you. Questions to ask your health care provider When should I start colorectal cancer screening? What is my risk for colorectal cancer? How often do I need screening? Which screening tests do I need? How do I get my test results? What do my results mean? Where to find more information To learn more, go to these websites: American Cancer Society at prombar.it. Then: Click Search and type colorectal cancer screening. Find the link you need. National Cancer Institute: stockbudget.co.uk This information is not intended to replace advice given to you by your health care provider. Make sure you discuss any questions you have with your health care provider. Document Revised: 11/09/2023 Document Reviewed: 11/09/2023 Elsevier Patient Education  2025 [employer]. Prostate Cancer Screening  Prostate cancer screening is testing that is done to check for the presence of prostate cancer in men. The prostate gland is a walnut-sized gland that is located below the bladder and in front of the rectum in males. The function of the prostate is to add fluid to semen during ejaculation. Prostate cancer is one of the most common types of cancer in men. Who should have prostate cancer screening? Screening recommendations vary based on age and other risk factors, as well as between the professional organizations who make the recommendations. In general, screening is recommended if: You are age 61 to  12 and have an average risk for prostate cancer. You should talk with your health care provider about your need for screening and how often screening should be done. Because most prostate cancers are slow growing and will not cause death, screening in this age group is generally reserved for men who have a 10- to 15-year life expectancy. You are younger than age 26, and you have these risk factors: Having a father, brother, or uncle who has been diagnosed with prostate cancer. The risk is higher if your family member's cancer occurred at an early age or if you have multiple family members with prostate cancer at an early age. Being a male who is Black or is of Caribbean or sub-Saharan African descent. In general, screening is not recommended if: You are younger  than age 55. You are between the ages of 43 and 61 and you have no risk factors. You are 45 years of age or older. At this age, the risks that screening can cause are greater than the benefits that it may provide. If you are at high risk for prostate cancer, your health care provider may recommend that you have screenings more often or that you start screening at a younger age. How is screening for prostate cancer done? The recommended prostate cancer screening test is a blood test called the prostate-specific antigen (PSA) test. PSA is a protein that is made in the prostate. As you age, your prostate naturally produces more PSA. Abnormally high PSA levels may be caused by: Prostate cancer. An enlarged prostate that is not caused by cancer (benign prostatic hyperplasia, or BPH). This condition is very common in older men. A prostate gland infection (prostatitis) or urinary tract infection. Certain medicines such as male hormones (like testosterone) or other medicines that raise testosterone levels. A rectal exam may be done as part of prostate cancer screening to help provide information about the size of your prostate gland. When a rectal exam  is performed, it should be done after the PSA level is drawn to avoid any effect on the results. Depending on the PSA results, you may need more tests, such as: A physical exam to check the size of your prostate gland, if not done as part of screening. Blood and imaging tests. A procedure to remove tissue samples from your prostate gland for testing (biopsy). This is the only way to know for certain if you have prostate cancer. What are the benefits of prostate cancer screening? Screening can help to identify cancer at an early stage, before symptoms start and when the cancer can be treated more easily. There is a small chance that screening may lower your risk of dying from prostate cancer. The chance is small because prostate cancer is a slow-growing cancer, and most men with prostate cancer die from a different cause. What are the risks of prostate cancer screening? The main risk of prostate cancer screening is diagnosing and treating prostate cancer that would never have caused any symptoms or problems. This is called overdiagnosisand overtreatment. PSA screening cannot tell you if your PSA is high due to cancer or a different cause. A prostate biopsy is the only procedure to diagnose prostate cancer. Even the results of a biopsy may not tell you if your cancer needs to be treated. Slow-growing prostate cancer may not need any treatment other than monitoring, so diagnosing and treating it may cause unnecessary stress or other side effects. Questions to ask your health care provider When should I start prostate cancer screening? What is my risk for prostate cancer? How often do I need screening? What type of screening tests do I need? How do I get my test results? What do my results mean? Do I need treatment? Where to find more information The American Cancer Society: www.cancer.org American Urological Association: www.auanet.org Contact a health care provider if: You have difficulty  urinating. You have pain when you urinate or ejaculate. You have blood in your urine or semen. You have pain in your back or in the area of your prostate. Summary Prostate cancer is a common type of cancer in men. The prostate gland is located below the bladder and in front of the rectum. This gland adds fluid to semen during ejaculation. Prostate cancer screening may identify cancer at an early stage,  when the cancer can be treated more easily and is less likely to have spread to other areas of the body. The prostate-specific antigen (PSA) test is the recommended screening test for prostate cancer, but it has associated risks. Discuss the risks and benefits of prostate cancer screening with your health care provider. If you are age 75 or older, the risks that screening can cause are greater than the benefits that it may provide. This information is not intended to replace advice given to you by your health care provider. Make sure you discuss any questions you have with your health care provider. Document Revised: 05/16/2021 Document Reviewed: 05/16/2021 Elsevier Patient Education  2024 Elsevier Inc. GERD in Adults: Diet Changes When you have gastroesophageal reflux disease (GERD), you may need to make changes to your diet. Choosing the right foods can help with your symptoms. Think about working with an expert in healthy eating called a dietitian. They can help you make healthy food choices. What are tips for following this plan? Reading food labels Look for foods that are low in saturated fat. Foods that may help with your symptoms include: Foods with less than 5% of daily value (DV) of fat. Foods with 0 grams of trans fat. Cooking [employer] in ways that don't use a lot of fat. These ways include: Baking. Steaming. Grilling. Broiling. To add flavor, try to use herbs that are low in spice and acidity. Avoid frying your food. Meal planning  Eat small meals often rather than  eating 3 large meals each day. Eat your meals slowly in a place where you feel relaxed. If told by your health care provider, avoid: Foods that cause symptoms. Keep a food diary to keep track of foods that cause symptoms. Alcohol. Drinking a lot of liquid with meals. General instructions For 2-3 hours after you eat, avoid: Bending over. Exercise. Lying down. Chew sugar-free gum after meals. What foods should I eat? Eat a healthy diet. Try to include: Foods with high amounts of fiber. These include: Fruits and vegetables. Whole grains and beans. Low-fat dairy products. Lean meats, fish, and poultry. Egg whites. Foods that cause symptoms in someone else may not cause symptoms for you. Work with your provider to find foods that are safe for you. The items listed above may not be all the foods and drinks you can have. Talk with a dietitian to learn more. The items listed above may not be a complete list of foods and beverages you can eat and drink. Contact a dietitian for more information. What foods should I avoid? Limiting some of these foods may help with your symptoms. Each person is different. Talk with a dietitian or your provider to help you find the exact foods to avoid. Some of the foods to avoid may include: Fruits Fruits with a lot of acid in them. These may include citrus fruits, such as oranges, grapefruit, pineapple, and lemons. Vegetables Deep-fried vegetables, such as French fries. Vegetables, sauces, or toppings made with added fat and vegetables with acid in them. These may include tomatoes and tomato products, chili peppers, onions, garlic, and horseradish. Grains Pastries or quick breads with added fat. Meats and other proteins High-fat meats, such as fatty beef or pork, hot dogs, ribs, ham, sausage, salami, and bacon. Fried meat or protein, such as fried fish and fried chicken. Egg yolks. Fats and oils Butter. Margarine. Shortening. Ghee. Drinks Coffee and other  drinks with caffeine in them. Fizzy and sugary drinks, such as soda  and energy drinks. Fruit juice made with acidic fruits, such as orange or grapefruit. Tomato juice. Sweets and desserts Chocolate and cocoa. Donuts. Seasonings and condiments Mint, such as peppermint and spearmint. Condiments, herbs, or seasonings that cause symptoms. These may include curry, hot sauce, or vinegar-based salad dressings. The items listed above may not be all the foods and drinks you should avoid. Talk with a dietitian to learn more. Questions to ask your health care provider Changes to your diet and everyday life are often the first steps taken to manage symptoms of GERD. If these changes don't help, talk with your provider about taking medicines. Where to find more information International Foundation for Gastrointestinal Disorders: aboutgerd.org This information is not intended to replace advice given to you by your health care provider. Make sure you discuss any questions you have with your health care provider. Document Revised: 10/02/2023 Document Reviewed: 04/18/2023 Elsevier Patient Education  2024 [employer] Inc. GERD in Adults: What to Know  Gastroesophageal reflux (GER) is when acid from your stomach flows up into your esophagus. Your esophagus is the part of your body that moves food from your mouth to your stomach. Normally, food goes down and stays in your stomach to be digested. But with GER, food and stomach acid may go back up. You may have a disease called gastroesophageal reflux disease (GERD) if the reflux: Happens often. Causes very bad symptoms. Makes your esophagus sore and swollen. Over time, GERD can make small holes called ulcers in the lining of your esophagus. What are the causes? GERD is caused by a problem with the muscle between your esophagus and stomach. This muscle is called the lower esophageal sphincter (LES). When it's weak or not normal, it doesn't close like it should. This  means food and stomach acid can go back up into your esophagus. The muscle can be weak if: You smoke or use products with tobacco in them. You're pregnant. You have a type of hernia called a hiatal hernia. You eat certain foods and drinks. These include: Alcohol. Coffee. Chocolate. Onions. Peppermint. What increases the risk? Being overweight. Having a disease that affects your connective tissue. Taking NSAIDs, such as ibuprofen. What are the signs or symptoms? Heartburn. Trouble swallowing. Pain when you swallow. The feeling of having a lump in your throat. A bitter taste in your mouth. Bad breath. Having an upset or bloated stomach. Burping. Chest pain. Other conditions can also cause chest pain. Make sure you see your health care provider if you have chest pain. Wheezing. This is when you make high-pitched whistling sounds when you breathe, most often when you breathe out. A long-term cough or a cough at night. How is this diagnosed? GERD may be diagnosed based on your medical history and a physical exam. You may also have tests. These may include: An endoscopy. This test looks at your stomach and esophagus with a small camera. A barium swallow test. This shows the shape and size of your esophagus and how well it's working. Tests of your esophagus to check for: Acid levels. Pressure. How is this treated? Treatment may depend on how bad your symptoms are. It may include: Changes to your diet and daily life. Medicines. Surgery. Follow these instructions at home: Eating and drinking Follow an eating plan as told by your provider. You may need to avoid certain foods and drinks. These may include: Coffee and tea, with or without caffeine. Alcohol. Energy drinks and sports drinks. Fizzy drinks or sodas. Chocolate and cocoa.  Peppermint and mint flavorings. Garlic and onions. Horseradish. Spicy and acidic foods. These include: Peppers. Chili powder and curry  powder. Vinegar. Hot sauces and BBQ sauce. Citrus fruits and juices. These include: Oranges. Lemons. Limes. Tomato-based foods. These include: Red sauce and pizza with red sauce. Chili. Salsa. Fried and fatty foods. These include: Donuts. French fries. Potato chips. High-fat dressings. High-fat meats. These include: Hot dogs and sausage. Rib eye steak. Ham and bacon. High-fat dairy items. These include: Whole milk. Butter. Cream cheese. Eat small meals often. Avoid eating big meals. Avoid drinking lots of liquid with your meals. Try not to eat meals during the 2-3 hours before bedtime. Try not to lie down right after you eat. Do not exercise right after you eat. Lifestyle  If you're overweight, lose an amount of weight that's healthy for you. Ask your provider about a safe weight loss goal. Do not smoke, vape, or use nicotine or tobacco. Wear loose clothes. Do not wear things that are tight around your waist. When you sleep, try: Raising the head of your bed about 6 inches (15 cm). You can use a wedge to do this. Lying down on your left side. Try to lower your stress. If you need help doing this, ask your provider. General instructions Take your medicines only as told. Do not take aspirin or ibuprofen unless you're told to. Watch for any changes in your symptoms. Do not bend over if it makes your symptoms worse. Contact a health care provider if: You have new symptoms. You have trouble: Drinking. Swallowing. Eating. It hurts to swallow. You have wheezing. You have a cough that won't go away. Your voice is hoarse. Your symptoms don't get better with treatment. Get help right away if: You have pain all of a sudden in your: Arm. Neck. Jaw. Teeth. Back. You feel sweaty, dizzy, or light-headed all of a sudden. You faint. You have chest pain or shortness of breath. You vomit and the vomit is: Green, yellow, or black. Looks like blood or coffee  grounds. Your poop is red, bloody, or black. These symptoms may be an emergency. Call 911 right away. Do not wait to see if the symptoms will go away. Do not drive yourself to the hospital. This information is not intended to replace advice given to you by your health care provider. Make sure you discuss any questions you have with your health care provider. Document Revised: 10/02/2023 Document Reviewed: 04/18/2023 [employer] Patient Education  2024 [employer]  The above assessment and management plan was discussed with the patient. The patient verbalized understanding of and has agreed to the management plan. Patient is aware to call the clinic if they develop any new symptoms or if symptoms persist or worsen. Patient is aware when to return to the clinic for a follow-up visit. Patient educated on when it is appropriate to go to the emergency department.    [place] [facility] [place] Thompson, DNP [facility] [address] [place], [place] [address] [phone]       [1] No Known Allergies  "

## 2024-12-17 ENCOUNTER — Other Ambulatory Visit: Payer: Self-pay | Admitting: Nurse Practitioner

## 2024-12-17 ENCOUNTER — Encounter (INDEPENDENT_AMBULATORY_CARE_PROVIDER_SITE_OTHER): Payer: Self-pay | Admitting: *Deleted

## 2024-12-17 DIAGNOSIS — R0683 Snoring: Secondary | ICD-10-CM

## 2024-12-17 LAB — CBC WITH DIFFERENTIAL/PLATELET
Basophils Absolute: 0.1 x10E3/uL (ref 0.0–0.2)
Basos: 1 %
EOS (ABSOLUTE): 0.1 x10E3/uL (ref 0.0–0.4)
Eos: 2 %
Hematocrit: 48.6 % (ref 37.5–51.0)
Hemoglobin: 16.2 g/dL (ref 13.0–17.7)
Immature Grans (Abs): 0 x10E3/uL (ref 0.0–0.1)
Immature Granulocytes: 0 %
Lymphocytes Absolute: 2.9 x10E3/uL (ref 0.7–3.1)
Lymphs: 35 %
MCH: 30.4 pg (ref 26.6–33.0)
MCHC: 33.3 g/dL (ref 31.5–35.7)
MCV: 91 fL (ref 79–97)
Monocytes Absolute: 0.5 x10E3/uL (ref 0.1–0.9)
Monocytes: 7 %
Neutrophils Absolute: 4.6 x10E3/uL (ref 1.4–7.0)
Neutrophils: 54 %
Platelets: 184 x10E3/uL (ref 150–450)
RBC: 5.33 x10E6/uL (ref 4.14–5.80)
RDW: 13.3 % (ref 11.6–15.4)
WBC: 8.2 x10E3/uL (ref 3.4–10.8)

## 2024-12-17 LAB — CMP14+EGFR
ALT: 40 IU/L (ref 0–44)
AST: 21 IU/L (ref 0–40)
Albumin: 4.7 g/dL (ref 4.1–5.1)
Alkaline Phosphatase: 87 IU/L (ref 47–123)
BUN/Creatinine Ratio: 17 (ref 9–20)
BUN: 15 mg/dL (ref 6–24)
Bilirubin Total: 0.5 mg/dL (ref 0.0–1.2)
CO2: 23 mmol/L (ref 20–29)
Calcium: 9.7 mg/dL (ref 8.7–10.2)
Chloride: 102 mmol/L (ref 96–106)
Creatinine, Ser: 0.9 mg/dL (ref 0.76–1.27)
Globulin, Total: 2 g/dL (ref 1.5–4.5)
Glucose: 102 mg/dL — AB (ref 70–99)
Potassium: 4.6 mmol/L (ref 3.5–5.2)
Sodium: 139 mmol/L (ref 134–144)
Total Protein: 6.7 g/dL (ref 6.0–8.5)
eGFR: 109 mL/min/1.73

## 2024-12-17 LAB — PSA, TOTAL AND FREE
PSA, Free Pct: 28.3 %
PSA, Free: 0.17 ng/mL
Prostate Specific Ag, Serum: 0.6 ng/mL (ref 0.0–4.0)

## 2024-12-17 LAB — LIPID PANEL
Cholesterol, Total: 230 mg/dL — AB (ref 100–199)
HDL: 36 mg/dL — AB
LDL CALC COMMENT:: 6.4 ratio — AB (ref 0.0–5.0)
LDL Chol Calc (NIH): 151 mg/dL — AB (ref 0–99)
Triglycerides: 235 mg/dL — AB (ref 0–149)
VLDL Cholesterol Cal: 43 mg/dL — AB (ref 5–40)

## 2024-12-17 LAB — THYROID PANEL WITH TSH
Free Thyroxine Index: 2.6 (ref 1.2–4.9)
T3 Uptake Ratio: 26 % (ref 24–39)
T4, Total: 9.9 ug/dL (ref 4.5–12.0)
TSH: 2.21 u[IU]/mL (ref 0.450–4.500)

## 2024-12-18 ENCOUNTER — Ambulatory Visit: Payer: Self-pay | Admitting: Nurse Practitioner

## 2024-12-18 DIAGNOSIS — E782 Mixed hyperlipidemia: Secondary | ICD-10-CM

## 2025-06-17 ENCOUNTER — Encounter: Admitting: Family Medicine
